# Patient Record
Sex: Female | Born: 1945
Health system: Southern US, Community
[De-identification: ages and names within clinical notes are randomized; demographics above are authoritative.]

## PROBLEM LIST (undated history)

## (undated) DIAGNOSIS — E785 Hyperlipidemia, unspecified: Secondary | ICD-10-CM

## (undated) DIAGNOSIS — Z8669 Personal history of other diseases of the nervous system and sense organs: Secondary | ICD-10-CM

## (undated) DIAGNOSIS — E119 Type 2 diabetes mellitus without complications: Secondary | ICD-10-CM

## (undated) HISTORY — DX: Type 2 diabetes mellitus without complications: E11.9

## (undated) HISTORY — DX: Hyperlipidemia, unspecified: E78.5

## (undated) HISTORY — PX: TONSILLECTOMY: SUR1361

## (undated) HISTORY — DX: Personal history of other diseases of the nervous system and sense organs: Z86.69

---

## 1999-03-30 DIAGNOSIS — E119 Type 2 diabetes mellitus without complications: Secondary | ICD-10-CM

## 1999-03-30 HISTORY — DX: Type 2 diabetes mellitus without complications: E11.9

## 2014-05-07 DIAGNOSIS — E559 Vitamin D deficiency, unspecified: Secondary | ICD-10-CM | POA: Diagnosis not present

## 2014-05-07 DIAGNOSIS — R5383 Other fatigue: Secondary | ICD-10-CM | POA: Diagnosis not present

## 2014-05-07 DIAGNOSIS — D649 Anemia, unspecified: Secondary | ICD-10-CM | POA: Diagnosis not present

## 2014-05-07 DIAGNOSIS — E1165 Type 2 diabetes mellitus with hyperglycemia: Secondary | ICD-10-CM | POA: Diagnosis not present

## 2014-05-07 DIAGNOSIS — I10 Essential (primary) hypertension: Secondary | ICD-10-CM | POA: Diagnosis not present

## 2014-05-07 DIAGNOSIS — E780 Pure hypercholesterolemia: Secondary | ICD-10-CM | POA: Diagnosis not present

## 2014-05-07 DIAGNOSIS — N2581 Secondary hyperparathyroidism of renal origin: Secondary | ICD-10-CM | POA: Diagnosis not present

## 2014-05-07 DIAGNOSIS — M358 Other specified systemic involvement of connective tissue: Secondary | ICD-10-CM | POA: Diagnosis not present

## 2014-05-07 DIAGNOSIS — E038 Other specified hypothyroidism: Secondary | ICD-10-CM | POA: Diagnosis not present

## 2014-05-07 DIAGNOSIS — E1140 Type 2 diabetes mellitus with diabetic neuropathy, unspecified: Secondary | ICD-10-CM | POA: Diagnosis not present

## 2014-05-13 DIAGNOSIS — Z0000 Encounter for general adult medical examination without abnormal findings: Secondary | ICD-10-CM | POA: Diagnosis not present

## 2014-06-17 DIAGNOSIS — R920 Mammographic microcalcification found on diagnostic imaging of breast: Secondary | ICD-10-CM | POA: Diagnosis not present

## 2014-06-17 DIAGNOSIS — R928 Other abnormal and inconclusive findings on diagnostic imaging of breast: Secondary | ICD-10-CM | POA: Diagnosis not present

## 2014-06-26 DIAGNOSIS — M899 Disorder of bone, unspecified: Secondary | ICD-10-CM | POA: Diagnosis not present

## 2014-06-26 DIAGNOSIS — M8580 Other specified disorders of bone density and structure, unspecified site: Secondary | ICD-10-CM | POA: Diagnosis not present

## 2014-06-26 LAB — HM DEXA SCAN

## 2014-07-29 DIAGNOSIS — H11001 Unspecified pterygium of right eye: Secondary | ICD-10-CM | POA: Diagnosis not present

## 2014-07-29 DIAGNOSIS — E119 Type 2 diabetes mellitus without complications: Secondary | ICD-10-CM | POA: Diagnosis not present

## 2014-07-29 DIAGNOSIS — H1131 Conjunctival hemorrhage, right eye: Secondary | ICD-10-CM | POA: Diagnosis not present

## 2014-08-12 DIAGNOSIS — Z01419 Encounter for gynecological examination (general) (routine) without abnormal findings: Secondary | ICD-10-CM | POA: Diagnosis not present

## 2014-09-02 DIAGNOSIS — E559 Vitamin D deficiency, unspecified: Secondary | ICD-10-CM | POA: Diagnosis not present

## 2014-09-02 DIAGNOSIS — R5383 Other fatigue: Secondary | ICD-10-CM | POA: Diagnosis not present

## 2014-09-02 DIAGNOSIS — E1165 Type 2 diabetes mellitus with hyperglycemia: Secondary | ICD-10-CM | POA: Diagnosis not present

## 2014-09-02 DIAGNOSIS — E780 Pure hypercholesterolemia: Secondary | ICD-10-CM | POA: Diagnosis not present

## 2014-09-02 DIAGNOSIS — M358 Other specified systemic involvement of connective tissue: Secondary | ICD-10-CM | POA: Diagnosis not present

## 2014-09-02 DIAGNOSIS — D649 Anemia, unspecified: Secondary | ICD-10-CM | POA: Diagnosis not present

## 2014-09-02 DIAGNOSIS — E038 Other specified hypothyroidism: Secondary | ICD-10-CM | POA: Diagnosis not present

## 2014-09-02 DIAGNOSIS — I10 Essential (primary) hypertension: Secondary | ICD-10-CM | POA: Diagnosis not present

## 2014-09-02 DIAGNOSIS — E1140 Type 2 diabetes mellitus with diabetic neuropathy, unspecified: Secondary | ICD-10-CM | POA: Diagnosis not present

## 2014-09-02 LAB — HEPATIC FUNCTION PANEL
ALT: 17 U/L (ref 7–35)
AST: 23 U/L (ref 13–35)
Alkaline Phosphatase: 93.9 U/L (ref 25–125)

## 2014-09-02 LAB — BASIC METABOLIC PANEL
BUN: 21 mg/dL (ref 4–21)
Creatinine: 0.8 mg/dL (ref 0.5–1.1)
Glucose: 124 mg/dL
Potassium: 4.5 mmol/L (ref 3.4–5.3)
Sodium: 142 mmol/L (ref 137–147)

## 2014-09-02 LAB — LIPID PANEL
Cholesterol: 167 mg/dL (ref 0–200)
HDL: 74 mg/dL — AB (ref 35–70)
Triglycerides: 82 mg/dL (ref 40–160)

## 2014-09-02 LAB — POCT ERYTHROCYTE SEDIMENTATION RATE, NON-AUTOMATED: Sed Rate: 6 mm

## 2014-09-02 LAB — HEMOGLOBIN A1C: Hemoglobin A1C: 6.8

## 2014-09-03 LAB — CBC AND DIFFERENTIAL
HCT: 46 % (ref 36–46)
Hemoglobin: 15.1 g/dL (ref 12.0–16.0)
Platelets: 175 10*3/uL (ref 150–399)
WBC: 5.5 10^3/mL

## 2014-11-11 DIAGNOSIS — M25511 Pain in right shoulder: Secondary | ICD-10-CM | POA: Diagnosis not present

## 2014-11-11 DIAGNOSIS — E780 Pure hypercholesterolemia: Secondary | ICD-10-CM | POA: Diagnosis not present

## 2014-11-11 DIAGNOSIS — E119 Type 2 diabetes mellitus without complications: Secondary | ICD-10-CM | POA: Diagnosis not present

## 2015-01-13 DIAGNOSIS — E1165 Type 2 diabetes mellitus with hyperglycemia: Secondary | ICD-10-CM | POA: Diagnosis not present

## 2015-01-13 DIAGNOSIS — I10 Essential (primary) hypertension: Secondary | ICD-10-CM | POA: Diagnosis not present

## 2015-01-13 DIAGNOSIS — M25512 Pain in left shoulder: Secondary | ICD-10-CM | POA: Diagnosis not present

## 2015-01-13 DIAGNOSIS — E038 Other specified hypothyroidism: Secondary | ICD-10-CM | POA: Diagnosis not present

## 2015-01-13 DIAGNOSIS — E780 Pure hypercholesterolemia: Secondary | ICD-10-CM | POA: Diagnosis not present

## 2015-01-13 DIAGNOSIS — M25511 Pain in right shoulder: Secondary | ICD-10-CM | POA: Diagnosis not present

## 2015-01-13 DIAGNOSIS — N951 Menopausal and female climacteric states: Secondary | ICD-10-CM | POA: Diagnosis not present

## 2015-01-13 DIAGNOSIS — M358 Other specified systemic involvement of connective tissue: Secondary | ICD-10-CM | POA: Diagnosis not present

## 2015-01-13 DIAGNOSIS — E559 Vitamin D deficiency, unspecified: Secondary | ICD-10-CM | POA: Diagnosis not present

## 2015-01-13 DIAGNOSIS — E1140 Type 2 diabetes mellitus with diabetic neuropathy, unspecified: Secondary | ICD-10-CM | POA: Diagnosis not present

## 2015-01-13 DIAGNOSIS — D649 Anemia, unspecified: Secondary | ICD-10-CM | POA: Diagnosis not present

## 2015-01-13 LAB — LIPID PANEL
Cholesterol: 170 mg/dL (ref 0–200)
HDL: 65 mg/dL (ref 35–70)
Triglycerides: 127 mg/dL (ref 40–160)

## 2015-01-13 LAB — HEPATIC FUNCTION PANEL
ALT: 17 U/L (ref 7–35)
AST: 19 U/L (ref 13–35)
Alkaline Phosphatase: 91.9 U/L (ref 25–125)

## 2015-01-13 LAB — BASIC METABOLIC PANEL
BUN: 14 mg/dL (ref 4–21)
Creatinine: 0.7 mg/dL (ref <=1.1)
Glucose: 87 mg/dL
Potassium: 4.1 mmol/L (ref 3.4–5.3)
Sodium: 140 mmol/L (ref 137–147)

## 2015-01-13 LAB — HEMOGLOBIN A1C: Hemoglobin A1C: 6.9

## 2015-01-13 LAB — POCT ERYTHROCYTE SEDIMENTATION RATE, NON-AUTOMATED: Sed Rate: 6 mm

## 2015-01-14 LAB — CBC AND DIFFERENTIAL
HCT: 43 % (ref 36–46)
Hemoglobin: 14.4 g/dL (ref 12.0–16.0)
Platelets: 171 10*3/uL (ref 150–399)
WBC: 5.8 10^3/mL

## 2015-01-15 DIAGNOSIS — M67814 Other specified disorders of tendon, left shoulder: Secondary | ICD-10-CM | POA: Diagnosis not present

## 2015-01-15 DIAGNOSIS — M7552 Bursitis of left shoulder: Secondary | ICD-10-CM | POA: Diagnosis not present

## 2015-01-15 DIAGNOSIS — M7542 Impingement syndrome of left shoulder: Secondary | ICD-10-CM | POA: Diagnosis not present

## 2015-01-27 DIAGNOSIS — Z23 Encounter for immunization: Secondary | ICD-10-CM | POA: Diagnosis not present

## 2015-02-12 DIAGNOSIS — Z23 Encounter for immunization: Secondary | ICD-10-CM | POA: Diagnosis not present

## 2015-05-15 DIAGNOSIS — E119 Type 2 diabetes mellitus without complications: Secondary | ICD-10-CM | POA: Diagnosis not present

## 2015-05-15 DIAGNOSIS — H9201 Otalgia, right ear: Secondary | ICD-10-CM | POA: Diagnosis not present

## 2015-05-15 DIAGNOSIS — E7800 Pure hypercholesterolemia, unspecified: Secondary | ICD-10-CM | POA: Diagnosis not present

## 2015-05-16 LAB — LIPID PANEL
Cholesterol: 156 mg/dL (ref 0–200)
HDL: 70 mg/dL (ref 35–70)
LDL Cholesterol: 86 mg/dL
Triglycerides: 109 mg/dL (ref 40–160)

## 2015-05-16 LAB — BASIC METABOLIC PANEL
Creatinine: 0.8 mg/dL (ref 0.5–1.1)
Glucose: 143 mg/dL
Potassium: 3.8 mmol/L (ref 3.4–5.3)
Sodium: 139 mmol/L (ref 137–147)

## 2015-05-16 LAB — HEPATIC FUNCTION PANEL
ALT: 16 U/L (ref 7–35)
AST: 22 U/L (ref 13–35)
Alkaline Phosphatase: 82 U/L (ref 25–125)
Bilirubin, Total: 0.4 mg/dL

## 2015-05-16 LAB — CBC AND DIFFERENTIAL
HCT: 45 % (ref 36–46)
Hemoglobin: 15.2 g/dL (ref 12.0–16.0)
Platelets: 173 10*3/uL (ref 150–399)
WBC: 5.5 10^3/mL

## 2015-05-19 DIAGNOSIS — I10 Essential (primary) hypertension: Secondary | ICD-10-CM | POA: Diagnosis not present

## 2015-05-19 DIAGNOSIS — E559 Vitamin D deficiency, unspecified: Secondary | ICD-10-CM | POA: Diagnosis not present

## 2015-05-19 DIAGNOSIS — E236 Other disorders of pituitary gland: Secondary | ICD-10-CM | POA: Diagnosis not present

## 2015-05-19 DIAGNOSIS — E782 Mixed hyperlipidemia: Secondary | ICD-10-CM | POA: Diagnosis not present

## 2015-05-19 DIAGNOSIS — M810 Age-related osteoporosis without current pathological fracture: Secondary | ICD-10-CM | POA: Diagnosis not present

## 2015-05-19 DIAGNOSIS — E1165 Type 2 diabetes mellitus with hyperglycemia: Secondary | ICD-10-CM | POA: Diagnosis not present

## 2015-07-21 DIAGNOSIS — J208 Acute bronchitis due to other specified organisms: Secondary | ICD-10-CM | POA: Diagnosis not present

## 2015-07-21 DIAGNOSIS — J028 Acute pharyngitis due to other specified organisms: Secondary | ICD-10-CM | POA: Diagnosis not present

## 2015-08-08 DIAGNOSIS — E559 Vitamin D deficiency, unspecified: Secondary | ICD-10-CM | POA: Diagnosis not present

## 2015-08-08 DIAGNOSIS — I10 Essential (primary) hypertension: Secondary | ICD-10-CM | POA: Diagnosis not present

## 2015-08-08 DIAGNOSIS — N951 Menopausal and female climacteric states: Secondary | ICD-10-CM | POA: Diagnosis not present

## 2015-08-08 DIAGNOSIS — D649 Anemia, unspecified: Secondary | ICD-10-CM | POA: Diagnosis not present

## 2015-08-08 DIAGNOSIS — E1165 Type 2 diabetes mellitus with hyperglycemia: Secondary | ICD-10-CM | POA: Diagnosis not present

## 2015-08-08 DIAGNOSIS — E782 Mixed hyperlipidemia: Secondary | ICD-10-CM | POA: Diagnosis not present

## 2015-08-08 DIAGNOSIS — E1140 Type 2 diabetes mellitus with diabetic neuropathy, unspecified: Secondary | ICD-10-CM | POA: Diagnosis not present

## 2015-08-08 DIAGNOSIS — G9009 Other idiopathic peripheral autonomic neuropathy: Secondary | ICD-10-CM | POA: Diagnosis not present

## 2015-08-08 DIAGNOSIS — E038 Other specified hypothyroidism: Secondary | ICD-10-CM | POA: Diagnosis not present

## 2015-08-08 DIAGNOSIS — R5383 Other fatigue: Secondary | ICD-10-CM | POA: Diagnosis not present

## 2015-12-03 DIAGNOSIS — H2513 Age-related nuclear cataract, bilateral: Secondary | ICD-10-CM | POA: Diagnosis not present

## 2015-12-03 DIAGNOSIS — E119 Type 2 diabetes mellitus without complications: Secondary | ICD-10-CM | POA: Diagnosis not present

## 2015-12-03 DIAGNOSIS — H11001 Unspecified pterygium of right eye: Secondary | ICD-10-CM | POA: Diagnosis not present

## 2016-03-19 DIAGNOSIS — Z23 Encounter for immunization: Secondary | ICD-10-CM | POA: Diagnosis not present

## 2016-03-22 DIAGNOSIS — E782 Mixed hyperlipidemia: Secondary | ICD-10-CM | POA: Diagnosis not present

## 2016-03-22 DIAGNOSIS — N951 Menopausal and female climacteric states: Secondary | ICD-10-CM | POA: Diagnosis not present

## 2016-03-22 DIAGNOSIS — R5383 Other fatigue: Secondary | ICD-10-CM | POA: Diagnosis not present

## 2016-03-22 DIAGNOSIS — D649 Anemia, unspecified: Secondary | ICD-10-CM | POA: Diagnosis not present

## 2016-03-22 DIAGNOSIS — E1165 Type 2 diabetes mellitus with hyperglycemia: Secondary | ICD-10-CM | POA: Diagnosis not present

## 2016-03-22 DIAGNOSIS — E1140 Type 2 diabetes mellitus with diabetic neuropathy, unspecified: Secondary | ICD-10-CM | POA: Diagnosis not present

## 2016-03-22 DIAGNOSIS — I10 Essential (primary) hypertension: Secondary | ICD-10-CM | POA: Diagnosis not present

## 2016-03-22 DIAGNOSIS — E038 Other specified hypothyroidism: Secondary | ICD-10-CM | POA: Diagnosis not present

## 2016-03-22 DIAGNOSIS — M358 Other specified systemic involvement of connective tissue: Secondary | ICD-10-CM | POA: Diagnosis not present

## 2016-03-22 DIAGNOSIS — E559 Vitamin D deficiency, unspecified: Secondary | ICD-10-CM | POA: Diagnosis not present

## 2016-06-30 ENCOUNTER — Ambulatory Visit (INDEPENDENT_AMBULATORY_CARE_PROVIDER_SITE_OTHER): Payer: Medicare Other | Admitting: Family Medicine

## 2016-06-30 ENCOUNTER — Ambulatory Visit: Payer: Self-pay | Admitting: Family Medicine

## 2016-06-30 ENCOUNTER — Encounter: Payer: Self-pay | Admitting: Family Medicine

## 2016-06-30 VITALS — BP 110/70 | HR 62 | Resp 12 | Ht 59.0 in | Wt 132.5 lb

## 2016-06-30 DIAGNOSIS — Z0000 Encounter for general adult medical examination without abnormal findings: Secondary | ICD-10-CM

## 2016-06-30 DIAGNOSIS — E785 Hyperlipidemia, unspecified: Secondary | ICD-10-CM

## 2016-06-30 DIAGNOSIS — E119 Type 2 diabetes mellitus without complications: Secondary | ICD-10-CM | POA: Diagnosis not present

## 2016-06-30 DIAGNOSIS — E1169 Type 2 diabetes mellitus with other specified complication: Secondary | ICD-10-CM | POA: Insufficient documentation

## 2016-06-30 LAB — BASIC METABOLIC PANEL
BUN: 17 mg/dL (ref 6–23)
CO2: 26 mEq/L (ref 19–32)
Calcium: 9.4 mg/dL (ref 8.4–10.5)
Chloride: 104 mEq/L (ref 96–112)
Creatinine, Ser: 0.71 mg/dL (ref 0.40–1.20)
GFR: 86.25 mL/min (ref >60.00)
Glucose, Bld: 77 mg/dL (ref 70–99)
Potassium: 4 mEq/L (ref 3.5–5.1)
Sodium: 138 mEq/L (ref 135–145)

## 2016-06-30 LAB — MICROALBUMIN / CREATININE URINE RATIO
Creatinine,U: 60.4 mg/dL
Microalb Creat Ratio: 1.2 mg/g (ref 0.0–30.0)
Microalb, Ur: <0.7 mg/dL (ref 0.0–1.9)

## 2016-06-30 LAB — HEMOGLOBIN A1C: Hgb A1c MFr Bld: 7.1 % — ABNORMAL HIGH (ref 4.6–6.5)

## 2016-06-30 NOTE — Progress Notes (Signed)
HPI:   Katherine Robinson is a 71 y.o. female, who is here today to establish care with me.  Former PCP: Dr Darlina Rumpf in [place]. Last preventive routine visit: A year ago.  She lives with her husband and mother.  Chronic medical problems: DM II,HLD, and recurrent external otitis among some.   Hx of "chronic left external otitis, due to anatomy variations. She uses otic abx prn.   Diabetes Mellitus II:   Currently on Invokana 100 mg and Onglyza 5 mg daily. When she was living in [place] she was following with endocrinologists. She was on Metformin before,did not tolerate well,so changed to Glucophage. She was on Glucophage for a few years,well tolerated until dose was increased, she developed GI symptoms ans skin pruritus.    HgA1C 02/2016 was 7.0 Checking BS's : 100-110. Hypoglycemia:Denies  She is tolerating medications well. She denies abdominal pain, nausea, vomiting, polydipsia, polyuria, or polyphagia. No numbness, tingling, or burning.  She follows a healthy diet, has not been exercising consistently for the past few weeks, husband just had hip surgery.  She was following with endocrinologists before she moved to [place].  Hyperlipidemia:  Currently on Crestor 5 mg 4 times per week Last FLP 02/2016.  Following a low fat diet: Yes.  She has not noted side effects with medication at this dose. She did not tolerate Simvastatin , it caused cramps.  Last eye exam was on 11/2015.   Review of Systems  Constitutional: Negative for activity change, appetite change, fatigue, fever and unexpected weight change.  HENT: Negative for mouth sores, nosebleeds and trouble swallowing.   Eyes: Negative for redness and visual disturbance.  Respiratory: Negative for cough, shortness of breath and wheezing.   Cardiovascular: Negative for chest pain, palpitations and leg swelling.  Gastrointestinal: Negative for abdominal pain, nausea and vomiting.       Negative  for changes in bowel habits.  Endocrine: Negative for polydipsia, polyphagia and polyuria.  Genitourinary: Negative for decreased urine volume, dysuria and hematuria.  Musculoskeletal: Negative for gait problem and myalgias.  Skin: Negative for rash and wound.  Neurological: Negative for syncope, weakness, numbness and headaches.  Hematological: Negative for adenopathy. Does not bruise/bleed easily.  Psychiatric/Behavioral: Negative for confusion and sleep disturbance. The patient is nervous/anxious.       No current outpatient prescriptions on file prior to visit.   No current facility-administered medications on file prior to visit.      Past Medical History:  Diagnosis Date  . Diabetes mellitus without complication ([place])   . History of ear infections   . Hyperlipidemia    Allergies  Allergen Reactions  . Ivp Dye [Iodinated Diagnostic Agents] Anaphylaxis  . Sulfa Antibiotics Anaphylaxis  . Percocet [Oxycodone-Acetaminophen] Nausea And Vomiting  . Glucophage [Metformin Hcl] Rash    Family History  Problem Relation Age of Onset  . Hypertension Father   . Diabetes Mother   . Depression Mother   . Mental illness Mother   . Kidney disease Mother   . Alzheimer's disease Mother     Social History   Social History  . Marital status: Married    Spouse name: N/A  . Number of children: N/A  . Years of education: N/A   Social History Main Topics  . Smoking status: Never Smoker  . Smokeless tobacco: Never Used  . Alcohol use No  . Drug use: No  . Sexual activity: Not Currently   Other Topics Concern  . None  Social History Narrative  . None    Vitals:   06/30/16 1109  BP: 110/70  Pulse: 62  Resp: 12   O2 sat 97% at RA. Body mass index is 26.76 kg/m.   Physical Exam  Nursing note and vitals reviewed. Constitutional: She is oriented to person, place, and time. She appears well-developed and well-nourished. No distress.  HENT:  Head: Atraumatic.    Mouth/Throat: Oropharynx is clear and moist and mucous membranes are normal.  Eyes: Conjunctivae and EOM are normal. Pupils are equal, round, and reactive to light.  Neck: No tracheal deviation present. No thyroid mass and no thyromegaly present.  Cardiovascular: Normal rate and regular rhythm.   No murmur heard. Pulses:      Dorsalis pedis pulses are 2+ on the right side, and 2+ on the left side.  Respiratory: Effort normal and breath sounds normal. No respiratory distress.  GI: Soft. She exhibits no mass. There is no hepatomegaly. There is no tenderness.  Musculoskeletal: She exhibits no edema or tenderness.  Lymphadenopathy:    She has no cervical adenopathy.  Neurological: She is alert and oriented to person, place, and time. She has normal strength. Coordination and gait normal.  Skin: Skin is warm. No rash noted. No erythema.  Psychiatric: She has a normal mood and affect.  Well groomed, good eye contact.    Diabetic foot exam:  Monofilament normal bilateral. Peripheral pulses present (DP). No calluses No hypertrophic/long toenails.   ASSESSMENT AND PLAN:   Garry was seen today for establish care.  Diagnoses and all orders for this visit:   Lab Results  Component Value Date   HGBA1C 7.1 (H) 06/30/2016   Lab Results  Component Value Date   CREATININE 0.71 06/30/2016   BUN 17 06/30/2016   NA 138 06/30/2016   K 4.0 06/30/2016   CL 104 06/30/2016   CO2 26 06/30/2016   Lab Results  Component Value Date   MICROALBUR <0.7 06/30/2016    Controlled type 2 diabetes mellitus without complication, without long-term current use of insulin ([place])   HgA1C pending. Goal < 7.0 No changes in current management, may need to adjust treatment accordingly. Regular exercise and healthy diet with avoidance of added sugar food intake is an important part of treatment and recommended. We discussed some side effects of current medications. Annual eye exam, periodic dental and  foot care recommended. F/U in 5-6 months  -     Hemoglobin A1c -     Basic metabolic panel -     Microalbumin / creatinine urine ratio   Hyperlipidemia LDL goal <100  Has not tolerated statins well in the past, current regimen has been better tolerated. No changes in current management. F/U in 6 months.  Healthcare maintenance  She is due for colonoscopy and would like to stop having mammogram. Pap smear 04/2015. She would like to hold on colonoscopy until her husband recovers from surgery. We discussed recommendations for breast cancer screening, she will consider having mammogram but prefers to hold on it for now.   45 min face to face OV. > 50% of the visit dedicated to discussion of side effects of medications, goals in regard to HgA1C and lipid numbers, and coordination of care. 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    Takisha Pelle G. [place], MD  [facility]. [place] office.

## 2016-06-30 NOTE — Patient Instructions (Signed)
A few things to remember from today's visit:   Healthcare maintenance  Controlled type 2 diabetes mellitus without complication, without long-term current use of insulin ([place]) - Plan: Hemoglobin P5X, Basic metabolic panel, Microalbumin / creatinine urine ratio  Hyperlipidemia LDL goal <100  HgA1C goal < 7.0. Avoid sugar added food:regular soft drinks, energy drinks, and sports drinks. candy. cakes. cookies. pies and cobblers. sweet rolls, pastries, and donuts. fruit drinks, such as fruitades and fruit punch. dairy desserts, such as ice cream  Mediterranean diet has showed benefits for sugar control.  How much and what type of carbohydrate foods are important for managing diabetes. The balance between how much insulin is in your body and the carbohydrate you eat makes a difference in your blood glucose levels.  Fasting blood sugar ideally 130 or less, 2 hours after meals less than 180.   Regular exercise also will help with controlling disease, daily brisk walking as tolerated for 15-30 min definitively will help.   Avoid skipping meals, blood sugar might drop and cause serious problems. Remember checking feet periodically, good dental hygiene, and annual eye exam.     Please be sure medication list is accurate. If a new problem present, please set up appointment sooner than planned today.

## 2016-06-30 NOTE — Progress Notes (Signed)
Pre visit review using our clinic review tool, if applicable. No additional management support is needed unless otherwise documented below in the visit note. 

## 2016-07-07 ENCOUNTER — Encounter: Payer: Self-pay | Admitting: Family Medicine

## 2016-07-17 ENCOUNTER — Encounter: Payer: Self-pay | Admitting: Family Medicine

## 2016-07-19 ENCOUNTER — Encounter: Payer: Self-pay | Admitting: Family Medicine

## 2016-08-10 ENCOUNTER — Encounter: Payer: Self-pay | Admitting: Family Medicine

## 2016-08-10 DIAGNOSIS — M8580 Other specified disorders of bone density and structure, unspecified site: Secondary | ICD-10-CM | POA: Insufficient documentation

## 2016-10-11 DIAGNOSIS — E1165 Type 2 diabetes mellitus with hyperglycemia: Secondary | ICD-10-CM | POA: Diagnosis not present

## 2016-10-11 DIAGNOSIS — E782 Mixed hyperlipidemia: Secondary | ICD-10-CM | POA: Diagnosis not present

## 2016-10-18 DIAGNOSIS — E1165 Type 2 diabetes mellitus with hyperglycemia: Secondary | ICD-10-CM | POA: Diagnosis not present

## 2016-10-18 DIAGNOSIS — Z833 Family history of diabetes mellitus: Secondary | ICD-10-CM | POA: Diagnosis not present

## 2016-10-18 DIAGNOSIS — E559 Vitamin D deficiency, unspecified: Secondary | ICD-10-CM | POA: Diagnosis not present

## 2016-10-18 DIAGNOSIS — E782 Mixed hyperlipidemia: Secondary | ICD-10-CM | POA: Diagnosis not present

## 2016-10-18 DIAGNOSIS — I10 Essential (primary) hypertension: Secondary | ICD-10-CM | POA: Diagnosis not present

## 2016-10-30 ENCOUNTER — Encounter: Payer: Self-pay | Admitting: Family Medicine

## 2016-11-01 ENCOUNTER — Telehealth: Payer: Self-pay

## 2016-11-01 NOTE — Telephone Encounter (Signed)
Received prior authorization request for Onglyza 5 mg tablets. Request submitted & is pending. Key: [id number]

## 2016-11-01 NOTE — Telephone Encounter (Signed)
PA approved, message sent to patient.

## 2016-11-02 ENCOUNTER — Encounter: Payer: Self-pay | Admitting: Family Medicine

## 2016-11-02 MED ORDER — ONGLYZA 5 MG PO TABS: 5.0000 mg | ORAL_TABLET | Freq: Every day | ORAL | 1 refills | Status: DC

## 2016-11-02 NOTE — Telephone Encounter (Signed)
PA pending for test strips. Key: [id number]

## 2016-11-03 NOTE — Telephone Encounter (Signed)
Received forms from insurance company. Forms filled out & faxed back to insurance company.

## 2016-11-05 ENCOUNTER — Telehealth: Payer: Self-pay

## 2016-11-05 MED ORDER — ONETOUCH ULTRASOFT LANCETS MISC: 3 refills | Status: DC

## 2016-11-05 MED ORDER — ONETOUCH ULTRA 2 W/DEVICE KIT: PACK | 0 refills | Status: DC

## 2016-11-05 MED ORDER — GLUCOSE BLOOD VI STRP: ORAL_STRIP | 3 refills | Status: DC

## 2016-11-05 NOTE — Telephone Encounter (Signed)
Rx sent in for new meter, strips, and lancets.

## 2016-11-05 NOTE — Addendum Note (Signed)
Addended by: Kateri Mc E on: 11/05/2016 04:41 PM   Modules accepted: Orders

## 2016-11-05 NOTE — Telephone Encounter (Signed)
Insurance will not cover Accu-Chek, but they will cover One Touch.   Okay to switch to One Touch?

## 2016-11-05 NOTE — Telephone Encounter (Signed)
That is fine, I do not have a preference in regard to glucometer. Thanks, BJ

## 2017-01-09 NOTE — Progress Notes (Signed)
HPI:   [name] is a 71 y.o. female, who is here today with her husband for 6 months follow up.   She was last seen on 06/30/16. Hx of DM II, she is following with Dr Altheimer, last seen in 09/2016. Next appt tomorrow for lab and next week to follow. HgA1C 09/2016 7.0.   Hyperlipidemia:  Currently on Crestor 5 mg 4 times per week. She would like to discontinue it because her "mouth is bitter." She is following a low fat diet. She tried Simvastatin before but it caused cramps.  She has not noted side effects with medication.   Lipid panel 09/2016  Lab Results   Range    LDL Direct 81 <130 mg/dL  Total Cholesterol 155 25 - 199 MG/DL  Triglycerides 72 10 - 150 MG/DL  HDL Cholesterol 66 35 - 135 MG/DL  Total Chol / HDL Cholesterol 2.3 <4.5     She is exercising regularly, treadmill and stationary bike.  Today she is having eye exam.  She is also due for DEXA and colonoscopy. Last colonoscopy in 04/2011, 2 small polyps found and 5 years follow up was recommended.  Denies abdominal pain, nausea, vomiting, changes in bowel habits, blood in stool or melena.  Last mammogram in 2016. She is not interested in having further mammograms. States that she does not understand the purpose given the facts she is not having any problem.  Hx of osteopenia. She is on Vit D 2000 U daily. States that Ca++ was not recommended because her Ca++ "was very high."    Review of Systems  Constitutional: Negative for activity change, appetite change, fatigue and fever.  HENT: Negative for mouth sores, nosebleeds and trouble swallowing.   Eyes: Negative for redness and visual disturbance.  Respiratory: Negative for cough, shortness of breath and wheezing.   Cardiovascular: Negative for chest pain, palpitations and leg swelling.  Gastrointestinal: Negative for abdominal pain, nausea and vomiting.       Negative for changes in bowel habits.  Endocrine: Negative for cold  intolerance, heat intolerance, polydipsia, polyphagia and polyuria.  Genitourinary: Negative for decreased urine volume, dysuria and hematuria.  Musculoskeletal: Negative for gait problem and myalgias.  Neurological: Negative for syncope, weakness and headaches.  Psychiatric/Behavioral: Negative for confusion and sleep disturbance. The patient is not nervous/anxious.       Current Outpatient Prescriptions on File Prior to Visit  Medication Sig Dispense Refill  . Blood Glucose Monitoring Suppl (ONE TOUCH ULTRA 2) w/Device KIT Use to test blood sugar twice daily. 1 each 0  . glucose blood test strip Use to test blood sugars twice daily. 200 each 3  . INVOKANA 100 MG TABS tablet Take 1 tablet by mouth daily  2  . Lancets (ONETOUCH ULTRASOFT) lancets Use to test blood sugars twice daily. 200 each 3  . ONGLYZA 5 MG TABS tablet Take 1 tablet (5 mg total) by mouth daily. 90 tablet 1   No current facility-administered medications on file prior to visit.      Past Medical History:  Diagnosis Date  . Diabetes mellitus without complication ([place]) 42/68/3419  . History of ear infections   . Hyperlipidemia    Allergies  Allergen Reactions  . Ivp Dye [Iodinated Diagnostic Agents] Anaphylaxis  . Sulfa Antibiotics Anaphylaxis  . Percocet [Oxycodone-Acetaminophen] Nausea And Vomiting  . Glucophage [Metformin Hcl] Rash    Social History   Social History  . Marital status: Married    Spouse  name: N/A  . Number of children: N/A  . Years of education: N/A   Social History Main Topics  . Smoking status: Never Smoker  . Smokeless tobacco: Never Used  . Alcohol use No  . Drug use: No  . Sexual activity: Not Currently   Other Topics Concern  . None   Social History Narrative  . None    Vitals:   01/10/17 0920  BP: 110/70  Pulse: 66  Resp: 12  SpO2: 97%   Body mass index is 26.53 kg/m.  Physical Exam  Nursing note and vitals reviewed. Constitutional: She is oriented to  person, place, and time. She appears well-developed and well-nourished. No distress.  HENT:  Head: Normocephalic and atraumatic.  Mouth/Throat: Oropharynx is clear and moist and mucous membranes are normal.  Eyes: Pupils are equal, round, and reactive to light. Conjunctivae are normal.  Neck: No tracheal deviation present. No thyroid mass and no thyromegaly present.  Cardiovascular: Normal rate and regular rhythm.   No murmur heard. Pulses:      Dorsalis pedis pulses are 2+ on the right side, and 2+ on the left side.  Respiratory: Effort normal and breath sounds normal. No respiratory distress.  GI: Soft. She exhibits no mass. There is no hepatomegaly. There is no tenderness.  Musculoskeletal: She exhibits no edema or tenderness.  Lymphadenopathy:    She has no cervical adenopathy.  Neurological: She is alert and oriented to person, place, and time. She has normal strength. Coordination and gait normal.  Skin: Skin is warm. No rash noted. No erythema.  Psychiatric: She has a normal mood and affect.  Well groomed, good eye contact.    ASSESSMENT AND PLAN:   Ms. Katherine Robinson was seen today for 6 months follow-up.   Diagnoses and all orders for this visit:  Osteopenia, unspecified location  Fall precautions. Continue Vit D 2000 U. In regard to Ca+, recommend 1200 mg through diet. Ca++ 06/2016 was 9.4, 09/2016 it was 9.1. Continue regular exercise, add wt lifting 1-2 times per week.  Controlled type 2 diabetes mellitus without complication, without long-term current use of insulin ([place])  Continue following with Dr Altheimer. She has lab appt tomorrow and eye exam today.  Hyperlipidemia LDL goal <100  We discussed benefits from statins as well as side effects. I sent a new Rx for Crestor with EC, which she tolerated better in the past, no bitter flavor left in her mouth. Continue low fat diet. Last FLP in 09/2016, she is having labs tomorrow.  -     rosuvastatin  (CRESTOR) 5 MG tablet; 1 tab 4 times per week.  Colon cancer screening -     Ambulatory referral to Gastroenterology  Breast cancer screening -     MM SCREENING BREAST TOMO BILATERAL; Future  Asymptomatic postmenopausal estrogen deficiency -     DG Bone Density; Future   25 min face to face OV. > 50% was dedicated to counseling for preventive guidelines,vaccination, above Dx, prognosis, treatment and some side effects of medications. Breast cancer prevention guidelines and statin benefits given her Hx of DM also discussed in detail. We also reviewed labs she had done in 09/2016. She will continue following with Dr Altheimer and I think I can see her annually, before if needed.    -Ms. Katherine Robinson was advised to return sooner than planned today if new concerns arise.      Xaniyah Buchholz G. [place], MD  [facility]. [place] office.

## 2017-01-10 ENCOUNTER — Encounter: Payer: Self-pay | Admitting: Family Medicine

## 2017-01-10 ENCOUNTER — Ambulatory Visit (INDEPENDENT_AMBULATORY_CARE_PROVIDER_SITE_OTHER): Payer: Medicare Other | Admitting: Family Medicine

## 2017-01-10 VITALS — BP 110/70 | HR 66 | Resp 12 | Ht 59.0 in | Wt 131.4 lb

## 2017-01-10 DIAGNOSIS — Z780 Asymptomatic menopausal state: Secondary | ICD-10-CM | POA: Diagnosis not present

## 2017-01-10 DIAGNOSIS — Z9189 Other specified personal risk factors, not elsewhere classified: Secondary | ICD-10-CM | POA: Insufficient documentation

## 2017-01-10 DIAGNOSIS — H524 Presbyopia: Secondary | ICD-10-CM | POA: Diagnosis not present

## 2017-01-10 DIAGNOSIS — Z1239 Encounter for other screening for malignant neoplasm of breast: Secondary | ICD-10-CM

## 2017-01-10 DIAGNOSIS — E119 Type 2 diabetes mellitus without complications: Secondary | ICD-10-CM

## 2017-01-10 DIAGNOSIS — H25813 Combined forms of age-related cataract, bilateral: Secondary | ICD-10-CM | POA: Diagnosis not present

## 2017-01-10 DIAGNOSIS — M8580 Other specified disorders of bone density and structure, unspecified site: Secondary | ICD-10-CM

## 2017-01-10 DIAGNOSIS — E785 Hyperlipidemia, unspecified: Secondary | ICD-10-CM | POA: Diagnosis not present

## 2017-01-10 DIAGNOSIS — Z1231 Encounter for screening mammogram for malignant neoplasm of breast: Secondary | ICD-10-CM | POA: Diagnosis not present

## 2017-01-10 DIAGNOSIS — Z1211 Encounter for screening for malignant neoplasm of colon: Secondary | ICD-10-CM

## 2017-01-10 DIAGNOSIS — H01001 Unspecified blepharitis right upper eyelid: Secondary | ICD-10-CM | POA: Diagnosis not present

## 2017-01-10 LAB — HM DIABETES EYE EXAM

## 2017-01-10 MED ORDER — ROSUVASTATIN CALCIUM 5 MG PO TABS: ORAL_TABLET | ORAL | 2 refills | Status: DC

## 2017-01-10 NOTE — Patient Instructions (Signed)
A few things to remember from today's visit:   Controlled type 2 diabetes mellitus without complication, without long-term current use of insulin ([place])  Hyperlipidemia LDL goal <100  Colon cancer screening - Plan: Ambulatory referral to Gastroenterology  Breast cancer screening - Plan: MM SCREENING BREAST TOMO BILATERAL  Osteopenia, unspecified location  At high risk for osteoporosis  Asymptomatic postmenopausal estrogen deficiency - Plan: DG Bone Density   [name], today we have followed on some of your chronic medical problems and they seem to be stable, so no changes in current management today.  Review medication list and be sure it is accurate.  -Remember a healthy diet and regular physical activity are very important for prevention as well as for well being; they also help with many chronic problems, decreasing the need of adding new medications and delaying or preventing possible complications.  I will see you back in 12 months.  Remember to arrange your follow up appt before leaving today.  Please follow sooner than planned if a new concern arises.  Please be sure medication list is accurate. If a new problem present, please set up appointment sooner than planned today.

## 2017-01-11 DIAGNOSIS — E782 Mixed hyperlipidemia: Secondary | ICD-10-CM | POA: Diagnosis not present

## 2017-01-11 DIAGNOSIS — E1165 Type 2 diabetes mellitus with hyperglycemia: Secondary | ICD-10-CM | POA: Diagnosis not present

## 2017-01-12 ENCOUNTER — Ambulatory Visit
Admission: RE | Admit: 2017-01-12 | Discharge: 2017-01-12 | Disposition: A | Discharge status: Home / Self Care | Payer: Medicare Other | Source: Ambulatory Visit | Attending: Family Medicine | Admitting: Family Medicine

## 2017-01-12 ENCOUNTER — Telehealth: Payer: Self-pay | Admitting: Family Medicine

## 2017-01-12 DIAGNOSIS — Z780 Asymptomatic menopausal state: Secondary | ICD-10-CM

## 2017-01-12 DIAGNOSIS — M8589 Other specified disorders of bone density and structure, multiple sites: Secondary | ICD-10-CM | POA: Diagnosis not present

## 2017-01-12 NOTE — Telephone Encounter (Signed)
Faxed ROI to [facility] [phone] mc

## 2017-01-13 ENCOUNTER — Encounter: Payer: Self-pay | Admitting: Family Medicine

## 2017-01-18 DIAGNOSIS — I10 Essential (primary) hypertension: Secondary | ICD-10-CM | POA: Diagnosis not present

## 2017-01-18 DIAGNOSIS — E1165 Type 2 diabetes mellitus with hyperglycemia: Secondary | ICD-10-CM | POA: Diagnosis not present

## 2017-01-18 DIAGNOSIS — E559 Vitamin D deficiency, unspecified: Secondary | ICD-10-CM | POA: Diagnosis not present

## 2017-01-18 DIAGNOSIS — E782 Mixed hyperlipidemia: Secondary | ICD-10-CM | POA: Diagnosis not present

## 2017-01-18 DIAGNOSIS — Z833 Family history of diabetes mellitus: Secondary | ICD-10-CM | POA: Diagnosis not present

## 2017-01-18 NOTE — Progress Notes (Signed)
Subjective:   Katherine Robinson is a 71 y.o. female who presents for Medicare Annual (Subsequent) preventive examination.  The Patient was informed that the wellness visit is to identify future health risk and educate and initiate measures that can reduce risk for increased disease through the lifespan.    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 Kathrin Penner to seen report   Flu vaccine scheduled for  10/1   Meds; Invokana 120m  Onglyza  Doing well; monitors her Diabetes very closely   Eye exam Sept 17 Dr. 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 SKathrin Pennersept  Complete eye exam  NO DR  and will fax in reprot      Sleep patterns: stays up at hs to write down her goals and review news  Pain - no pain     Cardiac Risk Factors Addressed Hyperlipidemia - chol 156; hdl 70; ldl 86 and trig 109 Diabetes A1c 7.1   Advanced Directives - no   Patient Care Team: JMartinique Betty G, MD as PCP - General (Family Medicine)    Cardiac Risk Factors include: advanced age (>549m, >6>73women);diabetes mellitus;family history of premature cardiovascular disease     Objective:     Vitals: BP 110/60   Pulse (!) 58   Ht _0  (1.575 m)   Wt 129 lb (58.5 kg)   SpO2 97%   BMI 23.59 kg/m   Body mass index is 23.59 kg/m.   Tobacco History  Smoking Status  . Never Smoker  Smokeless Tobacco  . Never Used     Counseling given: Yes   Past Medical History:  Diagnosis Date  . Diabetes mellitus without complication ([id number]. History of ear infections   . Hyperlipidemia    Past Surgical History:  Procedure Laterality Date  . TONSILLECTOMY     Family History  Problem Relation Age of Onset  . Hypertension Father   . Diabetes Mother   . Depression Mother   . Mental illness Mother   . Kidney disease Mother   . Alzheimer's disease Mother    History  Sexual Activity  . Sexual activity: Not Currently    Outpatient Encounter Prescriptions as of 01/19/2017  Medication Sig  . Blood Glucose Monitoring Suppl (ONE TOUCH ULTRA 2) w/Device KIT Use to test blood sugar twice daily.  . [place] Kitchenlucose blood test strip Use to test blood sugars twice daily.  . INVOKANA 100 MG  TABS tablet Take 1 tablet by mouth daily  . Lancets (ONETOUCH ULTRASOFT) lancets Use to test blood sugars twice daily.  . ONGLYZA 5 MG TABS tablet Take 1 tablet (5 mg total) by mouth daily.  . rosuvastatin (CRESTOR) 5 MG tablet 1 tab 4 times per week.   No facility-administered encounter medications on file as of 01/19/2017.     Activities of Daily Living In your present state of health, do you have any difficulty performing the following activities: 01/19/2017  Hearing? N  Vision? N  Difficulty concentrating or making decisions? N  Walking or climbing stairs? N  Dressing or bathing? N  Doing errands, shopping? N  Preparing Food and eating ? N  Using the Toilet? N  In the past six months, have you accidently leaked urine? N  Do you have problems with loss of bowel control? N  Managing your  Medications? N  Managing your Finances? N  Housekeeping or managing your Housekeeping? N    Patient Care Team: [place], Betty G, MD as PCP - General (Family Medicine)    Assessment:     Exercise Activities and Dietary recommendations Current Exercise Habits: Structured exercise class, Time (Minutes): 60, Frequency (Times/Week): 5, Weekly Exercise (Minutes/Week): 300, Intensity: Moderate  Goals    . patient          To learn french      Fall Risk Fall Risk  01/19/2017  Falls in the past year? No   Depression Screen PHQ 2/9 Scores 01/19/2017 01/10/2017  PHQ - 2 Score 0 0     Cognitive Function MMSE - Mini Mental State Exam 01/19/2017  Not completed: (No Data)        Immunization History  Administered Date(s) Administered  . Influenza-Unspecified 02/25/2016  . Pneumococcal Conjugate-13 02/08/2014  . Pneumococcal Polysaccharide-23 02/12/2015  . Tdap 04/26/2014   Screening Tests Health Maintenance  Topic Date Due  . INFLUENZA VACCINE  11/24/2016  . OPHTHALMOLOGY EXAM  12/02/2016  . HEMOGLOBIN A1C  12/31/2016  . MAMMOGRAM  01/28/2017 (Originally 06/17/2016)  . FOOT EXAM  06/30/2017  . URINE MICROALBUMIN  06/30/2017  . COLONOSCOPY  02/06/2024  . TETANUS/TDAP  04/26/2024  . DEXA SCAN  Completed  . Hepatitis C Screening  Completed  . PNA vac Low Risk Adult  Addressed      Plan:     PCP Notes  Health Maintenance Dexa osteopenia  - exercising 5 times per day Was told to eat sardines to increase calcium but does not eat sardines in her culture    Mammogram - scheduled for Oct 3rd; [facility]   Eye exam completed [id number]  - Dr. Kathrin Penner to send report; no issues   Flu is scheduled for 10/1 for her; spouse and ,mother  Abnormal Screens  none  Referrals  None but did educate regarding the [place] as well as online learning regarding caregiving  Patient concerns; As noted   Nurse Concerns; As noted   Next PCP apt - TBS      I have  personally reviewed and noted the following in the patient's chart:   . Medical and social history . Use of alcohol, tobacco or illicit drugs  . Current medications and supplements . Functional ability and status . Nutritional status . Physical activity . Advanced directives . List of other physicians . Hospitalizations, surgeries, and ER visits in previous 12 months . Vitals . Screenings to include cognitive, depression, and falls . Referrals and appointments  In addition,  I have reviewed and discussed with patient certain preventive protocols, quality metrics, and best practice recommendations. A written personalized care plan for preventive services as well as general preventive health recommendations were provided to patient.     Wynetta Fines, RN  01/19/2017

## 2017-01-19 ENCOUNTER — Ambulatory Visit (INDEPENDENT_AMBULATORY_CARE_PROVIDER_SITE_OTHER): Payer: Medicare Other

## 2017-01-19 ENCOUNTER — Ambulatory Visit: Payer: Medicare Other

## 2017-01-19 VITALS — BP 110/60 | HR 58 | Ht 62.0 in | Wt 129.0 lb

## 2017-01-19 DIAGNOSIS — Z0000 Encounter for general adult medical examination without abnormal findings: Secondary | ICD-10-CM | POA: Diagnosis not present

## 2017-01-19 NOTE — Patient Instructions (Addendum)
Katherine Robinson , Thank you for taking time to come for your Medicare Wellness Visit. I appreciate your ongoing commitment to your health goals. Please review the following plan we discussed and let me know if I can assist you in the future.   Alzheimer's Association / Family information and training  Please if time, you can review the online web from [place]; [email or web address]  [employer]  (please mail donations to this address) [address]  [place], [place] [address] P: [phone] F: [employer] [place], [place] [address] P: [phone] F: [address], [address] [place], [place] [address] P: [phone] F: [phone]  Families to call the 800 number to get information on all the resources in the area 24 hour 1 [phone]  Can provide resources; Questions about Dementia; Support groups; Give the caregiver information regarding respite ([facility]) Call the [place] [employer];as they oversee who gets the grant fund for certain areas (076-226-3335) Also have Tools to help navigate the needs of the patient  Opportunities for free educational programs online 100 support groups [place];    Prevention of falls: Remove rugs or any tripping hazards in the home Use Non slip mats in bathtubs and showers Placing grab bars next to the toilet and or shower Placing handrails on both sides of the stair way Adding extra lighting in the home.   Personal safety issues reviewed:  1. Consider starting a community watch program per [facility] 2.  Changes batteries is smoke detector and/or carbon monoxide detector  3.  If you have firearms; keep them in a safe place 4.  Wear protection when in the sun; Always wear sunscreen or a hat; It is good to have your doctor check your skin annually or review any new  areas of concern 5. Driving safety; Keep in the right lane; stay 3 car lengths behind the car in front of you on the highway; look 3 times prior to pulling out; carry your cell phone everywhere you go!    Learn about the Yellow Dot program:  The program allows first responders at your emergency to have access to who your physician is, as well as your medications and medical conditions.  Citizens requesting the Yellow Dot Packages should contact Master Corporal Nunzio Cobbs at the [facility] ([phone] for the first week of the program and beginning the week after Easter citizens should contact their Scientist, physiological.    These are the goals we discussed: Goals    . patient          To learn french       This is a list of the screening recommended for you and due dates:  Health Maintenance  Topic Date Due  . Flu Shot  11/24/2016  . Eye exam for diabetics  12/02/2016  . Hemoglobin A1C  12/31/2016  . Mammogram  01/28/2017*  . Complete foot exam   06/30/2017  . Urine Protein Check  06/30/2017  . Colon Cancer Screening  02/06/2024  . Tetanus Vaccine  04/26/2024  . DEXA scan (bone density measurement)  Completed  .  Hepatitis C: One time screening is recommended by Center for Disease Control  (CDC) for  adults born from [birth year] through 1965.   Completed  . Pneumonia vaccines  Addressed  *Topic was postponed. The date shown is not the original due date.  Fall Prevention in the Home Falls can cause injuries. They can happen to people of all ages. There are many things you can do to make your home safe and to help prevent falls. What can I do on the outside of my home?  Regularly fix the edges of walkways and driveways and fix any cracks.  Remove anything that might make you trip as you walk through a door, such as a raised step or threshold.  Trim any bushes or trees on the path to your home.  Use bright outdoor lighting.  Clear any  walking paths of anything that might make someone trip, such as rocks or tools.  Regularly check to see if handrails are loose or broken. Make sure that both sides of any steps have handrails.  Any raised decks and porches should have guardrails on the edges.  Have any leaves, snow, or ice cleared regularly.  Use sand or salt on walking paths during winter.  Clean up any spills in your garage right away. This includes oil or grease spills. What can I do in the bathroom?  Use night lights.  Install grab bars by the toilet and in the tub and shower. Do not use towel bars as grab bars.  Use non-skid mats or decals in the tub or shower.  If you need to sit down in the shower, use a plastic, non-slip stool.  Keep the floor dry. Clean up any water that spills on the floor as soon as it happens.  Remove soap buildup in the tub or shower regularly.  Attach bath mats securely with double-sided non-slip rug tape.  Do not have throw rugs and other things on the floor that can make you trip. What can I do in the bedroom?  Use night lights.  Make sure that you have a light by your bed that is easy to reach.  Do not use any sheets or blankets that are too big for your bed. They should not hang down onto the floor.  Have a firm chair that has side arms. You can use this for support while you get dressed.  Do not have throw rugs and other things on the floor that can make you trip. What can I do in the kitchen?  Clean up any spills right away.  Avoid walking on wet floors.  Keep items that you use a lot in easy-to-reach places.  If you need to reach something above you, use a strong step stool that has a grab bar.  Keep electrical cords out of the way.  Do not use floor polish or wax that makes floors slippery. If you must use wax, use non-skid floor wax.  Do not have throw rugs and other things on the floor that can make you trip. What can I do with my stairs?  Do not leave  any items on the stairs.  Make sure that there are handrails on both sides of the stairs and use them. Fix handrails that are broken or loose. Make sure that handrails are as long as the stairways.  Check any carpeting to make sure that it is firmly attached to the stairs. Fix any carpet that is loose or worn.  Avoid having throw rugs at the top or bottom of the stairs. If you do have throw rugs, attach them to the floor with carpet tape.  Make sure that you have a light switch at the top of the stairs and the bottom of the stairs. If you  do not have them, ask someone to add them for you. What else can I do to help prevent falls?  Wear shoes that: ? Do not have high heels. ? Have rubber bottoms. ? Are comfortable and fit you well. ? Are closed at the toe. Do not wear sandals.  If you use a stepladder: ? Make sure that it is fully opened. Do not climb a closed stepladder. ? Make sure that both sides of the stepladder are locked into place. ? Ask someone to hold it for you, if possible.  Clearly mark and make sure that you can see: ? Any grab bars or handrails. ? First and last steps. ? Where the edge of each step is.  Use tools that help you move around (mobility aids) if they are needed. These include: ? Canes. ? Walkers. ? Scooters. ? Crutches.  Turn on the lights when you go into a dark area. Replace any light bulbs as soon as they burn out.  Set up your furniture so you have a clear path. Avoid moving your furniture around.  If any of your floors are uneven, fix them.  If there are any pets around you, be aware of where they are.  Review your medicines with your doctor. Some medicines can make you feel dizzy. This can increase your chance of falling. Ask your doctor what other things that you can do to help prevent falls. This information is not intended to replace advice given to you by your health care provider. Make sure you discuss any questions you have with your  health care provider. Document Released: 02/06/2009 Document Revised: 09/18/2015 Document Reviewed: 05/17/2014 Elsevier Interactive Patient Education  2018 [place] Maintenance, Female Adopting a healthy lifestyle and getting preventive care can go a long way to promote health and wellness. Talk with your health care provider about what schedule of regular examinations is right for you. This is a good chance for you to check in with your provider about disease prevention and staying healthy. In between checkups, there are plenty of things you can do on your own. Experts have done a lot of research about which lifestyle changes and preventive measures are most likely to keep you healthy. Ask your health care provider for more information. Weight and diet Eat a healthy diet  Be sure to include plenty of vegetables, fruits, low-fat dairy products, and lean protein.  Do not eat a lot of foods high in solid fats, added sugars, or salt.  Get regular exercise. This is one of the most important things you can do for your health. ? Most adults should exercise for at least 150 minutes each week. The exercise should increase your heart rate and make you sweat (moderate-intensity exercise). ? Most adults should also do strengthening exercises at least twice a week. This is in addition to the moderate-intensity exercise.  Maintain a healthy weight  Body mass index (BMI) is a measurement that can be used to identify possible weight problems. It estimates body fat based on height and weight. Your health care provider can help determine your BMI and help you achieve or maintain a healthy weight.  For females 33 years of age and older: ? A BMI below 18.5 is considered underweight. ? A BMI of 18.5 to 24.9 is normal. ? A BMI of 25 to 29.9 is considered overweight. ? A BMI of 30 and above is considered obese.  Watch levels of cholesterol and blood lipids  You should start  having your blood  tested for lipids and cholesterol at 71 years of age, then have this test every 5 years.  You may need to have your cholesterol levels checked more often if: ? Your lipid or cholesterol levels are high. ? You are older than 71 years of age. ? You are at high risk for heart disease.  Cancer screening Lung Cancer  Lung cancer screening is recommended for adults 33-33 years old who are at high risk for lung cancer because of a history of smoking.  A yearly low-dose CT scan of the lungs is recommended for people who: ? Currently smoke. ? Have quit within the past 15 years. ? Have at least a 30-pack-year history of smoking. A pack year is smoking an average of one pack of cigarettes a day for 1 year.  Yearly screening should continue until it has been 15 years since you quit.  Yearly screening should stop if you develop a health problem that would prevent you from having lung cancer treatment.  Breast Cancer  Practice breast self-awareness. This means understanding how your breasts normally appear and feel.  It also means doing regular breast self-exams. Let your health care provider know about any changes, no matter how small.  If you are in your 20s or 30s, you should have a clinical breast exam (CBE) by a health care provider every 1-3 years as part of a regular health exam.  If you are 70 or older, have a CBE every year. Also consider having a breast X-ray (mammogram) every year.  If you have a family history of breast cancer, talk to your health care provider about genetic screening.  If you are at high risk for breast cancer, talk to your health care provider about having an MRI and a mammogram every year.  Breast cancer gene (BRCA) assessment is recommended for women who have family members with BRCA-related cancers. BRCA-related cancers include: ? Breast. ? Ovarian. ? Tubal. ? Peritoneal cancers.  Results of the assessment will determine the need for genetic counseling and  BRCA1 and BRCA2 testing.  Cervical Cancer Your health care provider may recommend that you be screened regularly for cancer of the pelvic organs (ovaries, uterus, and vagina). This screening involves a pelvic examination, including checking for microscopic changes to the surface of your cervix (Pap test). You may be encouraged to have this screening done every 3 years, beginning at age 57.  For women ages 55-65, health care providers may recommend pelvic exams and Pap testing every 3 years, or they may recommend the Pap and pelvic exam, combined with testing for human papilloma virus (HPV), every 5 years. Some types of HPV increase your risk of cervical cancer. Testing for HPV may also be done on women of any age with unclear Pap test results.  Other health care providers may not recommend any screening for nonpregnant women who are considered low risk for pelvic cancer and who do not have symptoms. Ask your health care provider if a screening pelvic exam is right for you.  If you have had past treatment for cervical cancer or a condition that could lead to cancer, you need Pap tests and screening for cancer for at least 20 years after your treatment. If Pap tests have been discontinued, your risk factors (such as having a new sexual partner) need to be reassessed to determine if screening should resume. Some women have medical problems that increase the chance of getting cervical cancer. In these cases, your health  care provider may recommend more frequent screening and Pap tests.  Colorectal Cancer  This type of cancer can be detected and often prevented.  Routine colorectal cancer screening usually begins at 71 years of age and continues through 71 years of age.  Your health care provider may recommend screening at an earlier age if you have risk factors for colon cancer.  Your health care provider may also recommend using home test kits to check for hidden blood in the stool.  A small camera  at the end of a tube can be used to examine your colon directly (sigmoidoscopy or colonoscopy). This is done to check for the earliest forms of colorectal cancer.  Routine screening usually begins at age 25.  Direct examination of the colon should be repeated every 5-10 years through 71 years of age. However, you may need to be screened more often if early forms of precancerous polyps or small growths are found.  Skin Cancer  Check your skin from head to toe regularly.  Tell your health care provider about any new moles or changes in moles, especially if there is a change in a mole's shape or color.  Also tell your health care provider if you have a mole that is larger than the size of a pencil eraser.  Always use sunscreen. Apply sunscreen liberally and repeatedly throughout the day.  Protect yourself by wearing long sleeves, pants, a wide-brimmed hat, and sunglasses whenever you are outside.  Heart disease, diabetes, and high blood pressure  High blood pressure causes heart disease and increases the risk of stroke. High blood pressure is more likely to develop in: ? People who have blood pressure in the high end of the normal range (130-139/85-89 mm Hg). ? People who are overweight or obese. ? People who are African American.  If you are 79-1 years of age, have your blood pressure checked every 3-5 years. If you are 28 years of age or older, have your blood pressure checked every year. You should have your blood pressure measured twice-once when you are at a hospital or clinic, and once when you are not at a hospital or clinic. Record the average of the two measurements. To check your blood pressure when you are not at a hospital or clinic, you can use: ? An automated blood pressure machine at a pharmacy. ? A home blood pressure monitor.  If you are between 57 years and 8 years old, ask your health care provider if you should take aspirin to prevent strokes.  Have regular diabetes  screenings. This involves taking a blood sample to check your fasting blood sugar level. ? If you are at a normal weight and have a low risk for diabetes, have this test once every three years after 71 years of age. ? If you are overweight and have a high risk for diabetes, consider being tested at a younger age or more often. Preventing infection Hepatitis B  If you have a higher risk for hepatitis B, you should be screened for this virus. You are considered at high risk for hepatitis B if: ? You were born in a country where hepatitis B is common. Ask your health care provider which countries are considered high risk. ? Your parents were born in a high-risk country, and you have not been immunized against hepatitis B (hepatitis B vaccine). ? You have HIV or AIDS. ? You use needles to inject street drugs. ? You live with someone who has hepatitis B. ?  You have had sex with someone who has hepatitis B. ? You get hemodialysis treatment. ? You take certain medicines for conditions, including cancer, organ transplantation, and autoimmune conditions.  Hepatitis C  Blood testing is recommended for: ? Everyone born from [birth year] through 1965. ? Anyone with known risk factors for hepatitis C.  Sexually transmitted infections (STIs)  You should be screened for sexually transmitted infections (STIs) including gonorrhea and chlamydia if: ? You are sexually active and are younger than 71 years of age. ? You are older than 71 years of age and your health care provider tells you that you are at risk for this type of infection. ? Your sexual activity has changed since you were last screened and you are at an increased risk for chlamydia or gonorrhea. Ask your health care provider if you are at risk.  If you do not have HIV, but are at risk, it may be recommended that you take a prescription medicine daily to prevent HIV infection. This is called pre-exposure prophylaxis (PrEP). You are considered at risk  if: ? You are sexually active and do not regularly use condoms or know the HIV status of your partner(s). ? You take drugs by injection. ? You are sexually active with a partner who has HIV.  Talk with your health care provider about whether you are at high risk of being infected with HIV. If you choose to begin PrEP, you should first be tested for HIV. You should then be tested every 3 months for as long as you are taking PrEP. Pregnancy  If you are premenopausal and you may become pregnant, ask your health care provider about preconception counseling.  If you may become pregnant, take 400 to 800 micrograms (mcg) of folic acid every day.  If you want to prevent pregnancy, talk to your health care provider about birth control (contraception). Osteoporosis and menopause  Osteoporosis is a disease in which the bones lose minerals and strength with aging. This can result in serious bone fractures. Your risk for osteoporosis can be identified using a bone density scan.  If you are 41 years of age or older, or if you are at risk for osteoporosis and fractures, ask your health care provider if you should be screened.  Ask your health care provider whether you should take a calcium or vitamin D supplement to lower your risk for osteoporosis.  Menopause may have certain physical symptoms and risks.  Hormone replacement therapy may reduce some of these symptoms and risks. Talk to your health care provider about whether hormone replacement therapy is right for you. Follow these instructions at home:  Schedule regular health, dental, and eye exams.  Stay current with your immunizations.  Do not use any tobacco products including cigarettes, chewing tobacco, or electronic cigarettes.  If you are pregnant, do not drink alcohol.  If you are breastfeeding, limit how much and how often you drink alcohol.  Limit alcohol intake to no more than 1 drink per day for nonpregnant women. One drink  equals 12 ounces of beer, 5 ounces of wine, or 1 ounces of hard liquor.  Do not use street drugs.  Do not share needles.  Ask your health care provider for help if you need support or information about quitting drugs.  Tell your health care provider if you often feel depressed.  Tell your health care provider if you have ever been abused or do not feel safe at home. This information is not intended to replace  advice given to you by your health care provider. Make sure you discuss any questions you have with your health care provider. Document Released: 10/26/2010 Document Revised: 09/18/2015 Document Reviewed: 01/14/2015 Elsevier Interactive Patient Education  2018 [place]

## 2017-01-19 NOTE — Progress Notes (Signed)
I have reviewed documentation from this visit and I agree with recommendations given.  Ameri Cahoon G. Alishah Schulte, MD  [facility]. [place] office.   

## 2017-01-25 ENCOUNTER — Ambulatory Visit (INDEPENDENT_AMBULATORY_CARE_PROVIDER_SITE_OTHER): Payer: Medicare Other | Admitting: *Deleted

## 2017-01-25 DIAGNOSIS — Z23 Encounter for immunization: Secondary | ICD-10-CM

## 2017-01-26 ENCOUNTER — Ambulatory Visit
Admission: RE | Admit: 2017-01-26 | Discharge: 2017-01-26 | Disposition: A | Discharge status: Home / Self Care | Payer: Medicare Other | Source: Ambulatory Visit | Attending: Family Medicine | Admitting: Family Medicine

## 2017-01-26 ENCOUNTER — Other Ambulatory Visit: Payer: Self-pay | Admitting: Family Medicine

## 2017-01-26 DIAGNOSIS — Z1239 Encounter for other screening for malignant neoplasm of breast: Secondary | ICD-10-CM

## 2017-01-26 DIAGNOSIS — Z1231 Encounter for screening mammogram for malignant neoplasm of breast: Secondary | ICD-10-CM | POA: Diagnosis not present

## 2017-01-27 ENCOUNTER — Telehealth: Payer: Self-pay | Admitting: Gastroenterology

## 2017-01-28 NOTE — Telephone Encounter (Signed)
Dr. Havery Moros reviewed records and has accepted patient. Patient due for recall colon 04/2021. Recall Colon has been entered. Patient has been informed.

## 2017-03-06 ENCOUNTER — Encounter: Payer: Self-pay | Admitting: Family Medicine

## 2017-04-03 ENCOUNTER — Other Ambulatory Visit: Payer: Self-pay | Admitting: Family Medicine

## 2017-04-14 DIAGNOSIS — E038 Other specified hypothyroidism: Secondary | ICD-10-CM | POA: Diagnosis not present

## 2017-04-14 DIAGNOSIS — E782 Mixed hyperlipidemia: Secondary | ICD-10-CM | POA: Diagnosis not present

## 2017-04-14 DIAGNOSIS — I10 Essential (primary) hypertension: Secondary | ICD-10-CM | POA: Diagnosis not present

## 2017-04-14 DIAGNOSIS — D649 Anemia, unspecified: Secondary | ICD-10-CM | POA: Diagnosis not present

## 2017-04-14 DIAGNOSIS — D519 Vitamin B12 deficiency anemia, unspecified: Secondary | ICD-10-CM | POA: Diagnosis not present

## 2017-04-14 DIAGNOSIS — E1140 Type 2 diabetes mellitus with diabetic neuropathy, unspecified: Secondary | ICD-10-CM | POA: Diagnosis not present

## 2017-04-14 DIAGNOSIS — E1165 Type 2 diabetes mellitus with hyperglycemia: Secondary | ICD-10-CM | POA: Diagnosis not present

## 2017-04-14 DIAGNOSIS — E236 Other disorders of pituitary gland: Secondary | ICD-10-CM | POA: Diagnosis not present

## 2017-04-14 DIAGNOSIS — E559 Vitamin D deficiency, unspecified: Secondary | ICD-10-CM | POA: Diagnosis not present

## 2017-04-14 DIAGNOSIS — N2581 Secondary hyperparathyroidism of renal origin: Secondary | ICD-10-CM | POA: Diagnosis not present

## 2017-04-14 DIAGNOSIS — R5383 Other fatigue: Secondary | ICD-10-CM | POA: Diagnosis not present

## 2017-05-30 ENCOUNTER — Encounter: Payer: Self-pay | Admitting: Family Medicine

## 2017-05-30 ENCOUNTER — Ambulatory Visit (INDEPENDENT_AMBULATORY_CARE_PROVIDER_SITE_OTHER): Payer: Medicare Other | Admitting: Family Medicine

## 2017-05-30 VITALS — BP 86/56 | HR 68 | Temp 98.0°F | Wt 129.0 lb

## 2017-05-30 DIAGNOSIS — B9789 Other viral agents as the cause of diseases classified elsewhere: Secondary | ICD-10-CM

## 2017-05-30 DIAGNOSIS — J069 Acute upper respiratory infection, unspecified: Secondary | ICD-10-CM | POA: Diagnosis not present

## 2017-05-30 MED ORDER — NEOMYCIN-POLYMYXIN-HC 1 % OT SOLN: OTIC | 2 refills | Status: AC

## 2017-05-30 NOTE — Progress Notes (Signed)
A to is a 72 year old married female nonsmoker who comes in today accompanied by her husband for evaluation of cold for 3 days  She has had congestion sore throat and cough. No fever chills nausea vomiting diarrhea etc. etc.  BP (!) 86/56 (BP Location: Left Arm, Patient Position: Sitting, Cuff Size: Normal)   Pulse 68   Temp 98 F (36.7 C) (Oral)   Wt 129 lb (58.5 kg)   SpO2 98%   BMI 23.59 kg/m  Well-developed well-nourished female no acute distress HEENT were negative neck was supple no adenopathy lungs are clear  #1 viral syndrome with cough............. treat symptomatically as outlined

## 2017-05-30 NOTE — Patient Instructions (Signed)
Drink lots of liquids  Chloraseptic  Hydromet............Marland Kitchen 1/2 teaspoon 2-3 times daily. For cough  Nettie pot...........Marland Kitchen 18 to the teaspoon of salt/1/8 of the teaspoon of baking soda....... irrigate your right nostril. Then refill the Nettie pot and irrigate it left nostril. Do this twice daily and to your cold symptoms resolve

## 2017-07-26 ENCOUNTER — Telehealth: Payer: Self-pay | Admitting: Family Medicine

## 2017-07-26 NOTE — Telephone Encounter (Signed)
Patient needs her glucose blood test strip  sent to:  [facility] [place], [place] to [facility]istered [facility] [phone] (Phone) [phone] (Fax)    A 90 day supply  The patient also wanted to let the MD know that she no longer sees the endocrinologist since her mother passed away in 07-04-2022

## 2017-07-27 ENCOUNTER — Other Ambulatory Visit: Payer: Self-pay | Admitting: *Deleted

## 2017-07-27 MED ORDER — GLUCOSE BLOOD VI STRP: ORAL_STRIP | 3 refills | Status: DC

## 2017-07-27 NOTE — Telephone Encounter (Signed)
Rx sent to pharmacy for test strips.

## 2017-08-08 DIAGNOSIS — R310 Gross hematuria: Secondary | ICD-10-CM | POA: Diagnosis not present

## 2017-08-08 DIAGNOSIS — E7800 Pure hypercholesterolemia, unspecified: Secondary | ICD-10-CM | POA: Diagnosis not present

## 2017-08-08 DIAGNOSIS — E119 Type 2 diabetes mellitus without complications: Secondary | ICD-10-CM | POA: Diagnosis not present

## 2017-08-08 DIAGNOSIS — R1031 Right lower quadrant pain: Secondary | ICD-10-CM | POA: Diagnosis not present

## 2017-08-09 DIAGNOSIS — R1031 Right lower quadrant pain: Secondary | ICD-10-CM | POA: Diagnosis not present

## 2017-08-09 DIAGNOSIS — R109 Unspecified abdominal pain: Secondary | ICD-10-CM | POA: Diagnosis not present

## 2017-08-09 DIAGNOSIS — I700 Atherosclerosis of aorta: Secondary | ICD-10-CM | POA: Diagnosis not present

## 2017-08-12 DIAGNOSIS — E119 Type 2 diabetes mellitus without complications: Secondary | ICD-10-CM | POA: Diagnosis not present

## 2017-08-12 DIAGNOSIS — R1031 Right lower quadrant pain: Secondary | ICD-10-CM | POA: Diagnosis not present

## 2017-12-16 ENCOUNTER — Other Ambulatory Visit: Payer: Self-pay | Admitting: *Deleted

## 2017-12-16 MED ORDER — ACCU-CHEK AVIVA PLUS W/DEVICE KIT: PACK | 0 refills | Status: AC

## 2017-12-16 MED ORDER — GLUCOSE BLOOD VI STRP: ORAL_STRIP | 12 refills | Status: DC

## 2017-12-16 MED ORDER — ACCU-CHEK SOFTCLIX LANCETS MISC: 12 refills | Status: DC

## 2018-01-24 ENCOUNTER — Ambulatory Visit: Payer: Medicare Other

## 2018-02-08 DIAGNOSIS — Z23 Encounter for immunization: Secondary | ICD-10-CM | POA: Diagnosis not present

## 2018-02-20 DIAGNOSIS — R5383 Other fatigue: Secondary | ICD-10-CM | POA: Diagnosis not present

## 2018-02-20 DIAGNOSIS — E119 Type 2 diabetes mellitus without complications: Secondary | ICD-10-CM | POA: Diagnosis not present

## 2018-02-20 DIAGNOSIS — Z0000 Encounter for general adult medical examination without abnormal findings: Secondary | ICD-10-CM | POA: Diagnosis not present

## 2018-02-20 DIAGNOSIS — E7800 Pure hypercholesterolemia, unspecified: Secondary | ICD-10-CM | POA: Diagnosis not present

## 2018-03-09 ENCOUNTER — Other Ambulatory Visit: Payer: Self-pay | Admitting: Family Medicine

## 2018-03-09 DIAGNOSIS — Z1231 Encounter for screening mammogram for malignant neoplasm of breast: Secondary | ICD-10-CM

## 2018-05-01 ENCOUNTER — Ambulatory Visit: Payer: Medicare Other

## 2018-05-22 ENCOUNTER — Ambulatory Visit
Admission: RE | Admit: 2018-05-22 | Discharge: 2018-05-22 | Disposition: A | Discharge status: Home / Self Care | Payer: Medicare Other | Source: Ambulatory Visit | Attending: Family Medicine | Admitting: Family Medicine

## 2018-05-22 ENCOUNTER — Other Ambulatory Visit: Payer: Self-pay | Admitting: Internal Medicine

## 2018-05-22 DIAGNOSIS — Z1231 Encounter for screening mammogram for malignant neoplasm of breast: Secondary | ICD-10-CM | POA: Diagnosis not present

## 2018-07-03 DIAGNOSIS — Z1211 Encounter for screening for malignant neoplasm of colon: Secondary | ICD-10-CM | POA: Diagnosis not present

## 2018-07-03 DIAGNOSIS — D120 Benign neoplasm of cecum: Secondary | ICD-10-CM | POA: Diagnosis not present

## 2018-07-03 DIAGNOSIS — K635 Polyp of colon: Secondary | ICD-10-CM | POA: Diagnosis not present

## 2018-07-03 DIAGNOSIS — Z5181 Encounter for therapeutic drug level monitoring: Secondary | ICD-10-CM | POA: Diagnosis not present

## 2018-07-04 ENCOUNTER — Encounter: Payer: Self-pay | Admitting: Family Medicine

## 2018-07-04 DIAGNOSIS — E7800 Pure hypercholesterolemia, unspecified: Secondary | ICD-10-CM | POA: Diagnosis not present

## 2018-07-04 DIAGNOSIS — E119 Type 2 diabetes mellitus without complications: Secondary | ICD-10-CM | POA: Diagnosis not present

## 2018-08-11 ENCOUNTER — Other Ambulatory Visit: Payer: Self-pay

## 2018-08-11 ENCOUNTER — Ambulatory Visit (INDEPENDENT_AMBULATORY_CARE_PROVIDER_SITE_OTHER): Payer: Medicare Other | Admitting: Internal Medicine

## 2018-08-11 ENCOUNTER — Encounter: Payer: Self-pay | Admitting: Internal Medicine

## 2018-08-11 VITALS — BP 90/60 | HR 65 | Temp 97.6°F | Ht 62.0 in | Wt 130.0 lb

## 2018-08-11 DIAGNOSIS — E1165 Type 2 diabetes mellitus with hyperglycemia: Secondary | ICD-10-CM | POA: Diagnosis not present

## 2018-08-11 NOTE — Patient Instructions (Addendum)
Please start checking sugars once a day at different times of the day.  Please continue: - Jardiance 10 mg daily before b'fast - Onglyza 5 mg daily but move it before b'fast  Please schedule an appt with Antonieta Iba with nutrition.  Please come back for a follow-up appointment in 3 months.  PATIENT INSTRUCTIONS FOR TYPE 2 DIABETES:  DIET AND EXERCISE Diet and exercise is an important part of diabetic treatment.  We recommended aerobic exercise in the form of brisk walking (working between 40-60% of maximal aerobic capacity, similar to brisk walking) for 150 minutes per week (such as 30 minutes five days per week) along with 3 times per week performing 'resistance' training (using various gauge rubber tubes with handles) 5-10 exercises involving the major muscle groups (upper body, lower body and core) performing 10-15 repetitions (or near fatigue) each exercise. Start at half the above goal but build slowly to reach the above goals. If limited by weight, joint pain, or disability, we recommend daily walking in a swimming pool with water up to waist to reduce pressure from joints while allow for adequate exercise.    BLOOD GLUCOSES Monitoring your blood glucoses is important for continued management of your diabetes. Please check your blood glucoses 2-4 times a day: fasting, before meals and at bedtime (you can rotate these measurements - e.g. one day check before the 3 meals, the next day check before 2 of the meals and before bedtime, etc.).   HYPOGLYCEMIA (low blood sugar) Hypoglycemia is usually a reaction to not eating, exercising, or taking too much insulin/ other diabetes drugs.  Symptoms include tremors, sweating, hunger, confusion, headache, etc. Treat IMMEDIATELY with 15 grams of Carbs: . 4 glucose tablets .  cup regular juice/soda . 2 tablespoons raisins . 4 teaspoons sugar . 1 tablespoon honey Recheck blood glucose in 15 mins and repeat above if still symptomatic/blood glucose  <100.  RECOMMENDATIONS TO REDUCE YOUR RISK OF DIABETIC COMPLICATIONS: * Take your prescribed MEDICATION(S) * Follow a DIABETIC diet: Complex carbs, fiber rich foods, (monounsaturated and polyunsaturated) fats * AVOID saturated/trans fats, high fat foods, >2,300 mg salt per day. * EXERCISE at least 5 times a week for 30 minutes or preferably daily.  * DO NOT SMOKE OR DRINK more than 1 drink a day. * Check your FEET every day. Do not wear tightfitting shoes. Contact us if you develop an ulcer * See your EYE doctor once a year or more if needed * Get a FLU shot once a year * Get a PNEUMONIA vaccine once before and once after age 52 years  GOALS:  * Your Hemoglobin A1c of <7%  * fasting sugars need to be <130 * after meals sugars need to be <180 (2h after you start eating) * Your Systolic BP should be 161 or lower  * Your Diastolic BP should be 80 or lower  * Your HDL (Good Cholesterol) should be 40 or higher  * Your LDL (Bad Cholesterol) should be 100 or lower. * Your Triglycerides should be 150 or lower  * Your Urine microalbumin (kidney function) should be <30 * Your Body Mass Index should be 25 or lower    Please consider the following ways to cut down carbs and fat and increase fiber and micronutrients in your diet: - substitute whole grain for white bread or pasta - substitute brown rice for white rice - substitute 90-calorie flat bread pieces for slices of bread when possible - substitute sweet potatoes or yams for  white potatoes - substitute humus for margarine - substitute tofu for cheese when possible - substitute almond or rice milk for regular milk (would not drink soy milk daily due to concern for soy estrogen influence on breast cancer risk) - substitute dark chocolate for other sweets when possible - substitute water - can add lemon or orange slices for taste - for diet sodas (artificial sweeteners will trick your body that you can eat sweets without getting calories and  will lead you to overeating and weight gain in the long run) - do not skip breakfast or other meals (this will slow down the metabolism and will result in more weight gain over time)  - can try smoothies made from fruit and almond/rice milk in am instead of regular breakfast - can also try old-fashioned (not instant) oatmeal made with almond/rice milk in am - order the dressing on the side when eating salad at a restaurant (pour less than half of the dressing on the salad) - eat as little meat as possible - can try juicing, but should not forget that juicing will get rid of the fiber, so would alternate with eating raw veg./fruits or drinking smoothies - use as little oil as possible, even when using olive oil - can dress a salad with a mix of balsamic vinegar and lemon juice, for e.g. - use agave nectar, stevia sugar, or regular sugar rather than artificial sweateners - steam or broil/roast veggies  - snack on veggies/fruit/nuts (unsalted, preferably) when possible, rather than processed foods - reduce or eliminate aspartame in diet (it is in diet sodas, chewing gum, etc) Read the labels!  Try to read Dr. Janene Harvey book: "Program for Reversing Diabetes" for other ideas for healthy eating.

## 2018-08-11 NOTE — Progress Notes (Signed)
Patient ID: Katherine Robinson, female   DOB: January 02, 1946, 73 y.o.   MRN: [id number]   HPI: Katherine Robinson is a 73 y.o.-year-old female, referred by her PCP, Dr.Aasmaa, for management of DM2, dx in 2000, non-insulin-dependent, fairly well controlled, without long-term complications. She moved to the area from [place].  Reviewed latest HbA1c level: 07/04/2018: HbA1c 7.0% 02/20/2018: HbA1c 7.1% Lab Results  Component Value Date   HGBA1C 7.1 (H) 06/30/2016   HGBA1C 6.9 01/13/2015   HGBA1C 6.8 09/02/2014   09/2016: C-peptide 1.46  Pt is on a regimen of: - Onglyza 5 mg at night - Jardiance 10 mg before breakfast She was on metformin until 2014 when she developed intolerance: "Bitter mouth".  She tried metformin ER but this caused generalized itching, rash, with generalized pain all over her body. She tried Invokana.   Pt checks her sugars 1x a day and they are: - am: 96-115, 132 - 2h after b'fast: n/c - before lunch: n/c - 2h after lunch: n/c - before dinner: n/c - 2h after dinner: n/c - bedtime: n/c - nighttime: n/c Lowest sugar was 96; she has hypoglycemia awareness at 70.  Highest sugar was 132.  Glucometer: Accu-Chek Aviva plus  Pt's meals are: - Breakfast: Oatmeal with pineapple, half a banana, raspberries, blueberries, yogurt, cottage cheese, 1 cup of tea, 1 wheat toast, feta cheese-small piece every morning - Lunch: Soup (1 cup), salad, half a cup of rice with veggies, chicken/fish and one fourth of a bowl from morning oatmeal with yogurt and 2 cookies - Dinner: Popcorn (100 cal) and 2 bottles of water - Snacks: no She saw dietitian in [place] when diagnosed in 2000. She was exercising consistently at [employer] by doing cardio 6 days a week until the coronavirus pandemic started and the gym closed.  Now no exercise.  - no CKD, last BUN/creatinine:  07/04/2018: 13/0.88, GFR 58, glucose 123 02/20/2018: ACR 4 Lab Results  Component Value Date   BUN 17  06/30/2016   BUN 14 01/13/2015   CREATININE 0.71 06/30/2016   CREATININE 0.8 05/16/2015   -+ Dyslipidemia; last set of lipids: 02/20/2018: 150/114/64/66 Lab Results  Component Value Date   CHOL 156 05/16/2015   HDL 70 05/16/2015   LDLCALC 86 05/16/2015   TRIG 109 05/16/2015  On Crestor 5 mg 4 times a week.  - last eye exam was in 11/2016: No DR.  - no numbness and tingling in her feet.  Pt has FH of DM in mother, daughter.  She also has a history of osteopenia-diagnosed in 04/2011.  She has vitamin D deficiency, psoriasis, history of PE 02/2018, colon polyp.  She is a prior ICU nurse, retired in 2016.  ROS: Constitutional: + Weight gain, no weight loss, no fatigue, no subjective hyperthermia, no subjective hypothermia, no nocturia Eyes: no blurry vision, no xerophthalmia ENT: no sore throat, no nodules palpated in neck, no dysphagia, no odynophagia, no hoarseness,, occasional tinnitus, no hypoacusis Cardiovascular: no CP, no SOB, no palpitations, no leg swelling Respiratory: no cough, no SOB, no wheezing Gastrointestinal: no N, no V, no D, no C, no acid reflux Musculoskeletal: no muscle, no joint aches Skin: no rash, + some hair loss Neurological: no tremors, no numbness or tingling/no dizziness/no HAs Psychiatric: no depression, no anxiety  Past Medical History:  Diagnosis Date  . Diabetes mellitus without complication ([place]) 40/34/7425  . History of ear infections   . Hyperlipidemia    Past Surgical History:  Procedure Laterality Date  .  TONSILLECTOMY     Social History   Socioeconomic History  . Marital status: Married    Spouse name: Not on file  . Number of children: 4 Kalman Shan, Aline August) (she also had an ectopic pregnancy)   . Years of education: Not on file  . Highest education level: Not on file  Occupational History  .  Retired ICU nurse  Social Needs  . Financial resource strain: Not on file  . Food insecurity:    Worry: Not on file     Inability: Not on file  . Transportation needs:    Medical: Not on file    Non-medical: Not on file  Tobacco Use  . Smoking status: Never Smoker  . Smokeless tobacco: Never Used  Substance and Sexual Activity  . Alcohol use:  Yes, wine, 2 drinks weekly  . Drug use: No   Current Outpatient Medications on File Prior to Visit  Medication Sig Dispense Refill  . ACCU-CHEK SOFTCLIX LANCETS lancets USE TO TEST BLOOD SUGAR TWICE DAILY. 100 each 12  . Blood Glucose Monitoring Suppl (ACCU-CHEK AVIVA PLUS) w/Device KIT USE TO TEST BLOOD SUGAR TWICE DAILY 1 kit 0  . Cholecalciferol (VITAMIN D) 2000 units CAPS Take 2,000 Units by mouth daily at 12 noon.    Marland Kitchen glucose blood (ACCU-CHEK AVIVA PLUS) test strip USE TO TEST BLOOD SUGAR TWICE DAILY 100 each 12  . JARDIANCE 10 MG TABS tablet     . Multiple Vitamin (MULTIVITAMIN) tablet Take 1 tablet by mouth daily.    . NEOMYCIN-POLYMYXIN-HYDROCORTISONE (CORTISPORIN) 1 % SOLN OTIC solution 2 drops affected ear at bedtime 10 mL 2  . ONGLYZA 5 MG TABS tablet TAKE 1 TABLET DAILY 90 tablet 1  . rosuvastatin (CRESTOR) 5 MG tablet 1 tab 4 times per week. 60 tablet 2   No current facility-administered medications on file prior to visit.    Allergies  Allergen Reactions  . Ivp Dye [Iodinated Diagnostic Agents] Anaphylaxis  . Sulfa Antibiotics Anaphylaxis  . Percocet [Oxycodone-Acetaminophen] Nausea And Vomiting  . Glucophage [Metformin Hcl] Rash   Family History  Problem Relation Age of Onset  . Hypertension Father   . Diabetes Mother   . Depression Mother   . Mental illness Mother   . Kidney disease Mother   . Alzheimer's disease Mother   . Breast cancer Neg Hx   Also, HTN in mother, HL in mother.  PE: BP 90/60   Pulse 65   Temp 97.6 F (36.4 C)   Ht 5' 2"  (1.575 m)   Wt 130 lb (59 kg)   SpO2 97%   BMI 23.78 kg/m  Wt Readings from Last 3 Encounters:  08/11/18 130 lb (59 kg)  05/30/17 129 lb (58.5 kg)  01/19/17 129 lb (58.5 kg)    Constitutional: Normal weight, in NAD Eyes: PERRLA, EOMI, no exophthalmos ENT: moist mucous membranes, no thyromegaly, no cervical lymphadenopathy Cardiovascular: RRR, No MRG Respiratory: CTA B Gastrointestinal: abdomen soft, NT, ND, BS+ Musculoskeletal: no deformities, strength intact in all 4 Skin: moist, warm, no rashes Neurological: no tremor with outstretched hands, DTR normal in all 4  ASSESSMENT: 1. DM2, non-insulin-dependent, uncontrolled, without long-term complications, but with hyperglycemia  PLAN:  1. Patient with long-standing, uncontrolled diabetes, on oral antidiabetic regimen, with good control.  At this visit, we reviewed her sugar log and her sugars appear very well controlled in the morning with only rare sugars above goal.  However, she is not checking sugars later in the day and  I strongly advised her to start doing so.  Her most recent HbA1c 7.0%, at goal, but this is in contrast with her sugars at home.  We discussed that I suspect that this is because her sugars increase throughout the day.  As of now, she is taking Jardiance in the morning and Onglyza at night.  Since Onglyza will mostly after her meals, we discussed about moving this in the morning.  I think this would be enough in helping with the increase in sugars throughout the day.  We also discussed about her diet and I made specific suggestions about how to improve her meals towards a more alkaline diet.  She is interested to learn more about nutrition and a referral to a dietitian was made today. -I do not feel that we need to make any other changes in her regimen at this point. - I suggested to:  Patient Instructions  Please start checking sugars once a day at different times of the day.  Please continue: - Jardiance 10 mg daily before b'fast - Onglyza 5 mg daily but move it before b'fast  Please schedule an appt with Antonieta Iba with nutrition.  Please come back for a follow-up appointment in 3  months.  - Strongly advised her to start checking sugars at different times of the day - check 1x a day, rotating checks - discussed about CBG targets for treatment: 80-130 mg/dL before meals and <180 mg/dL after meals; target HbA1c <7%. - given sugar log and advised how to fill it and to bring it at next appt  - given foot care handout and explained the principles  - given instructions for hypoglycemia management "15-15 rule"  - advised for yearly eye exams - she is due - UTD with flu shot, shingles and DTaP - Return to clinic in 3 mo with sugar log   Philemon Kingdom, MD PhD [facility] Endocrin[facility]

## 2018-08-12 ENCOUNTER — Encounter: Payer: Self-pay | Admitting: Internal Medicine

## 2018-08-18 ENCOUNTER — Encounter: Payer: Medicare Other | Admitting: Dietician

## 2018-08-29 DIAGNOSIS — E119 Type 2 diabetes mellitus without complications: Secondary | ICD-10-CM | POA: Diagnosis not present

## 2018-08-29 DIAGNOSIS — H25813 Combined forms of age-related cataract, bilateral: Secondary | ICD-10-CM | POA: Diagnosis not present

## 2018-08-29 DIAGNOSIS — H5201 Hypermetropia, right eye: Secondary | ICD-10-CM | POA: Diagnosis not present

## 2018-08-29 DIAGNOSIS — H04123 Dry eye syndrome of bilateral lacrimal glands: Secondary | ICD-10-CM | POA: Diagnosis not present

## 2018-08-29 LAB — HM DIABETES EYE EXAM

## 2018-09-05 ENCOUNTER — Encounter: Payer: Medicare Other | Attending: Internal Medicine | Admitting: Dietician

## 2018-09-05 ENCOUNTER — Other Ambulatory Visit: Payer: Self-pay

## 2018-09-05 DIAGNOSIS — E119 Type 2 diabetes mellitus without complications: Secondary | ICD-10-CM | POA: Insufficient documentation

## 2018-09-08 ENCOUNTER — Encounter: Payer: Self-pay | Admitting: Dietician

## 2018-09-08 NOTE — Progress Notes (Signed)
Diabetes Self-Management Education  Visit Type: First/Initial  Appt. Start Time: 1140 Appt. End Time: 1240  09/08/2018  Katherine Robinson, identified by name and date of birth, is a 73 y.o. female with a diagnosis of Diabetes: Type 2.   Patient is here today with her husband.  She states that he has struggled with depression for the last 2 years and did not participate in the appointment.    Distory includes type 2 Diabetes.  She has read "Reversing Diabetes" book by Dr. Alyssa Grove and would like to transition to plant based eating (although continue to prepare meat for her husband).  She doe snot eat red meat, chicken with the skin and has not used milk in the past 3 year. Her last A1C was 7% 06/2018. She also wanted me to update the record that she had shingles and tetenus shot 07/14/18, flu vacine 01/2018, and normal mamogram 04/2018. Medications include Jardiance, Onglyza, vitamin B-12  Patient is Katherine Robinson.  She lives with her husband and moved here from [place] 3 years ago. She worked as an Warden/ranger for 49 years.  She now does the majority of the household chores.   She was going to the gym 6 times per week prior to [place].  She does own a treadmill but does not feel as comfortable using this.  She cuts her lawn and does her own yard work.  ASSESSMENT  Height 5\' 2"  (1.575 m), weight 131 lb (59.4 kg). Body mass index is 23.96 kg/m.  Diabetes Self-Management Education - 09/08/18 1523      Visit Information   Visit Type  First/Initial      Initial Visit   Diabetes Type  Type 2    Are you currently following a meal plan?  No    Are you taking your medications as prescribed?  Yes    Date Diagnosed  2000      Health Coping   How would you rate your overall health?  Good      Psychosocial Assessment   Patient Belief/Attitude about Diabetes  Motivated to manage diabetes    Self-care barriers  None    Self-management support  Doctor's office    Other persons present   Patient;Spouse/SO    Patient Concerns  Nutrition/Meal planning    Special Needs  None    Preferred Learning Style  No preference indicated    Learning Readiness  Ready    How often do you need to have someone help you when you read instructions, pamphlets, or other written materials from your doctor or pharmacy?  1 - Never    What is the last grade level you completed in school?  graduate school      Pre-Education Assessment   Patient understands the diabetes disease and treatment process.  Needs Review    Patient understands incorporating nutritional management into lifestyle.  Needs Review    Patient undertands incorporating physical activity into lifestyle.  Needs Review    Patient understands using medications safely.  Needs Review    Patient understands monitoring blood glucose, interpreting and using results  Needs Review    Patient understands prevention, detection, and treatment of acute complications.  Needs Review    Patient understands prevention, detection, and treatment of chronic complications.  Needs Review    Patient understands how to develop strategies to address psychosocial issues.  Needs Review    Patient understands how to develop strategies to promote health/change behavior.  Needs Review  Complications   Last HgB A1C per patient/outside source  7 %   06/2018   How often do you check your blood sugar?  1-2 times/day    Have you had a dilated eye exam in the past 12 months?  Yes   08/30/18   Have you had a dental exam in the past 12 months?  No    Are you checking your feet?  Yes    How many days per week are you checking your feet?  7      Dietary Intake   Breakfast  hot tea with stevia, oatmeal, 3 prunes, pineapple, 1/2 banana, berries, 1 T cottage cheese, 1/3 small container yogurt, 1 slice White wheat toast with feta, 1/2 orange   9   Lunch  1/2 bowl chicken soup, 1/2 c. jasmine rice, vegetables, slamon or chicken, salad, "need to have dessert"- carrot coake  or 1/4 of the oatmeal from breakfast and rest of yogurt   2   Dinner  100 calorie popcorn, 2 crackers, 1 ounce cheese, 1/4 cup trail mix (nuts, raisins, banana chips, almonds)   7   Beverage(s)  water, hot tea with stevia, 2 ounces of wine with a small amount of peach syrup with lunch 2-3 times per week      Exercise   Exercise Type  Light (walking / raking leaves)   cuts lawn and does yard work   How many days per week to you exercise?  7    How many minutes per day do you exercise?  20    Total minutes per week of exercise  140      Patient Education   Previous Diabetes Education  Yes (please comment)   2000 when diagnosed   Nutrition management   Role of diet in the treatment of diabetes and the relationship between the three main macronutrients and blood glucose level;Meal options for control of blood glucose level and chronic complications.    Physical activity and exercise   Role of exercise on diabetes management, blood pressure control and cardiac health.    Medications  Reviewed patients medication for diabetes, action, purpose, timing of dose and side effects.    Monitoring  Purpose and frequency of SMBG.;Identified appropriate SMBG and/or A1C goals.;Daily foot exams;Yearly dilated eye exam    Acute complications  Taught treatment of hypoglycemia - the 15 rule.    Chronic complications  Relationship between chronic complications and blood glucose control    Psychosocial adjustment  Worked with patient to identify barriers to care and solutions;Role of stress on diabetes      Individualized Goals (developed by patient)   Nutrition  General guidelines for healthy choices and portions discussed    Physical Activity  Exercise 5-7 days per week;30 minutes per day    Medications  take my medication as prescribed    Monitoring   test my blood glucose as discussed    Reducing Risk  examine blood glucose patterns;Other (comment)   plant based   Health Coping  ask for help with  (comment)      Post-Education Assessment   Patient understands the diabetes disease and treatment process.  Demonstrates understanding / competency    Patient understands incorporating nutritional management into lifestyle.  Demonstrates understanding / competency    Patient undertands incorporating physical activity into lifestyle.  Demonstrates understanding / competency    Patient understands using medications safely.  Demonstrates understanding / competency    Patient understands monitoring blood glucose, interpreting  and using results  Demonstrates understanding / competency    Patient understands prevention, detection, and treatment of acute complications.  Demonstrates understanding / competency    Patient understands prevention, detection, and treatment of chronic complications.  Demonstrates understanding / competency    Patient understands how to develop strategies to address psychosocial issues.  Demonstrates understanding / competency    Patient understands how to develop strategies to promote health/change behavior.  Demonstrates understanding / competency      Outcomes   Expected Outcomes  Demonstrated interest in learning. Expect positive outcomes    Future DMSE  PRN    Program Status  Completed       Individualized Plan for Diabetes Self-Management Training:   Learning Objective:  Patient will have a greater understanding of diabetes self-management. Patient education plan is to attend individual and/or group sessions per assessed needs and concerns.   Plan:  Continue to read and transition to a more plant based diet per patient preference.  Discussed protein choices at each meal.  Discussed podcasts and other resources including Becoming Vegan book.  Meal options discussed. She is to continue to take vitamin B-12.  Expected Outcomes:  Demonstrated interest in learning. Expect positive outcomes  Education material provided: Plant based primer  If problems or  questions, patient to contact team via:  Phone and Email  Future DSME appointment: PRN

## 2018-09-16 IMAGING — MG DIGITAL SCREENING BILATERAL MAMMOGRAM WITH CAD
4 series · 4 of 4 positions shown · non-contrast
Comparison: Previous exam(s).

CLINICAL DATA: Screening.

EXAM:
DIGITAL SCREENING BILATERAL MAMMOGRAM WITH CAD

[L CC]
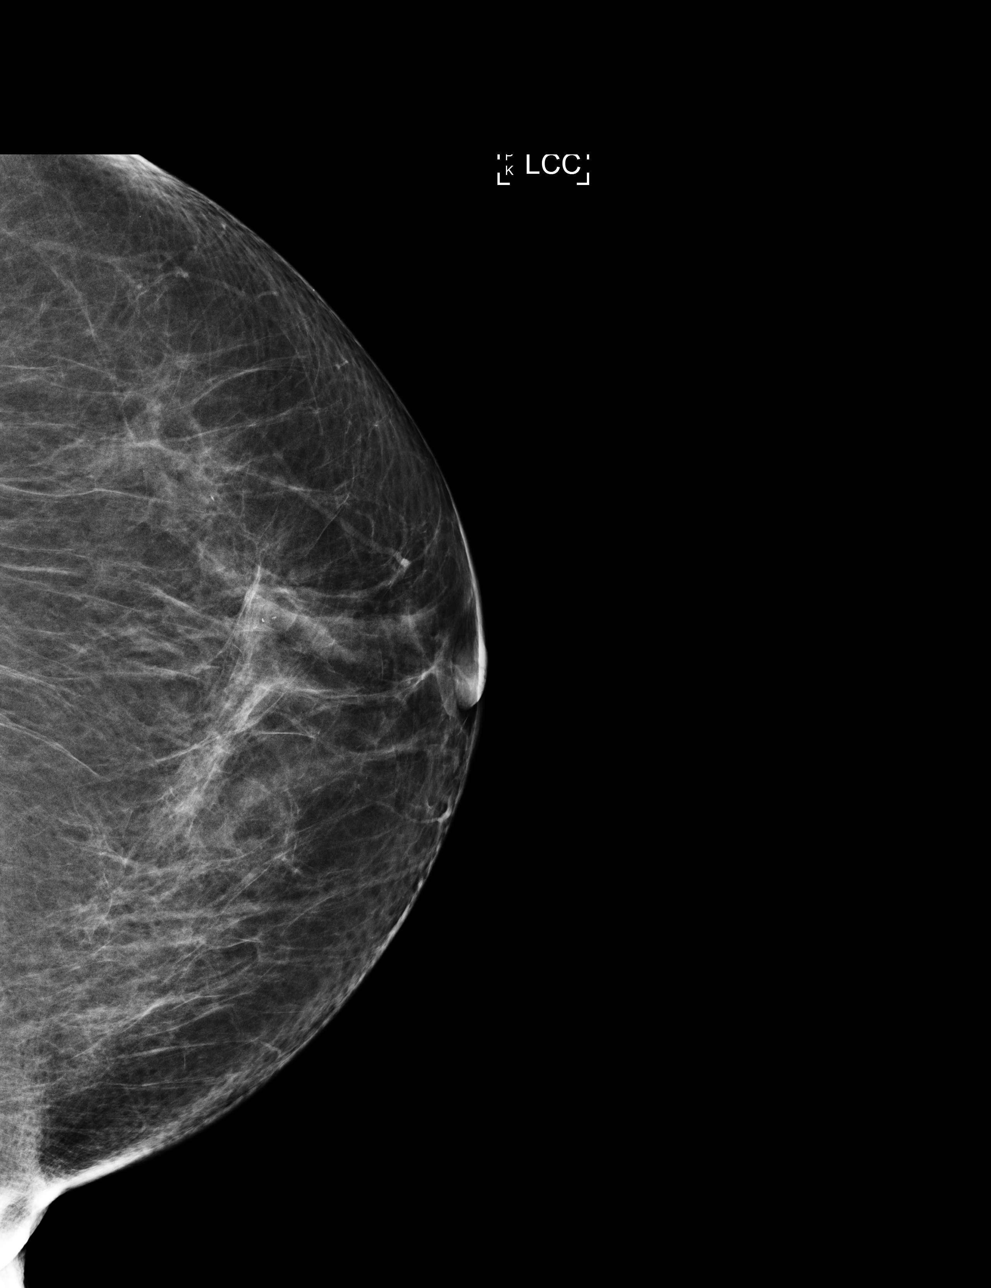

[R CC]
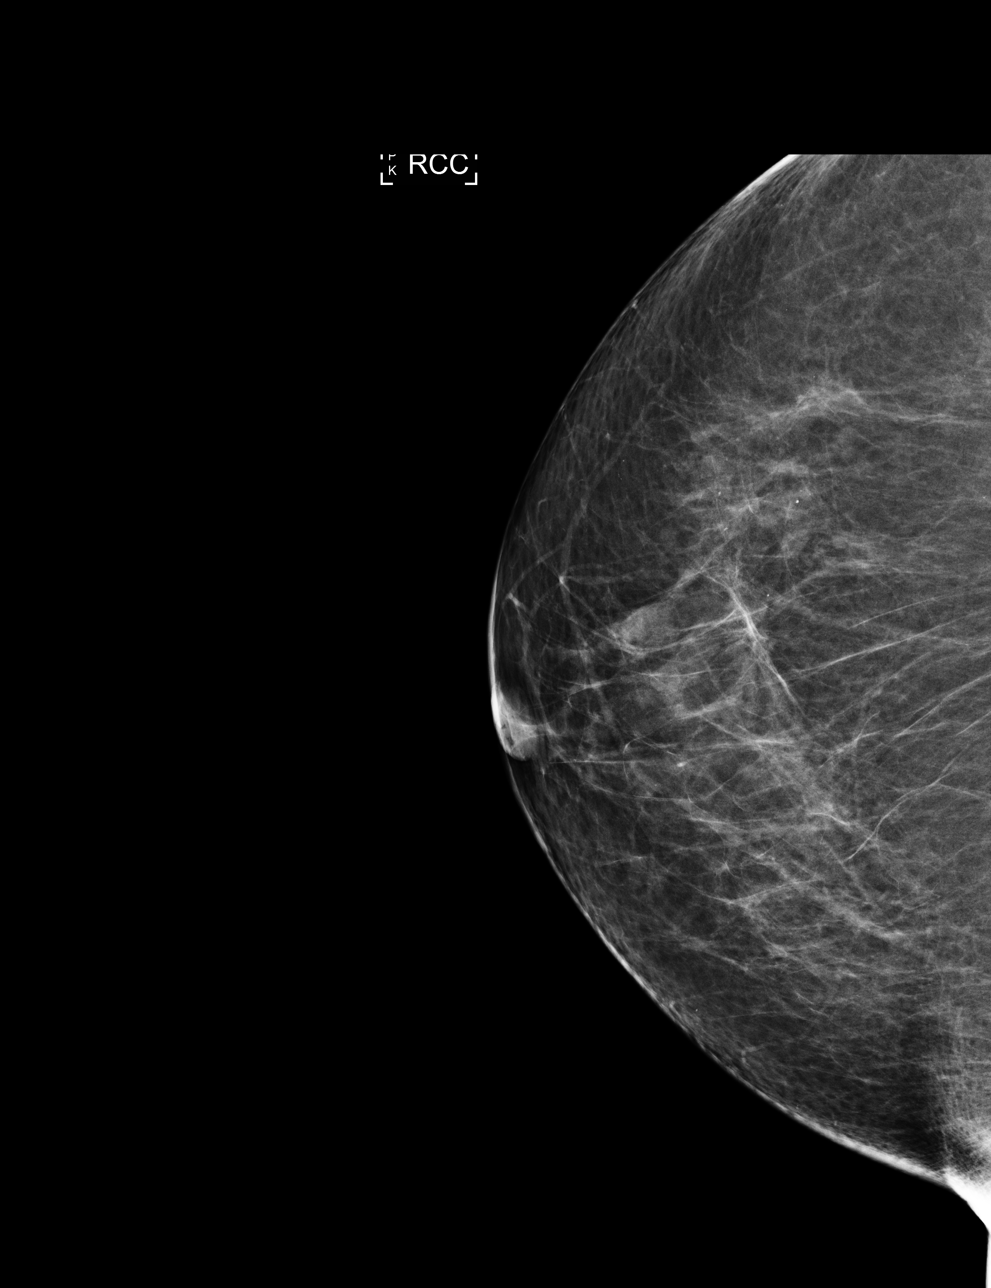

[R MLO]
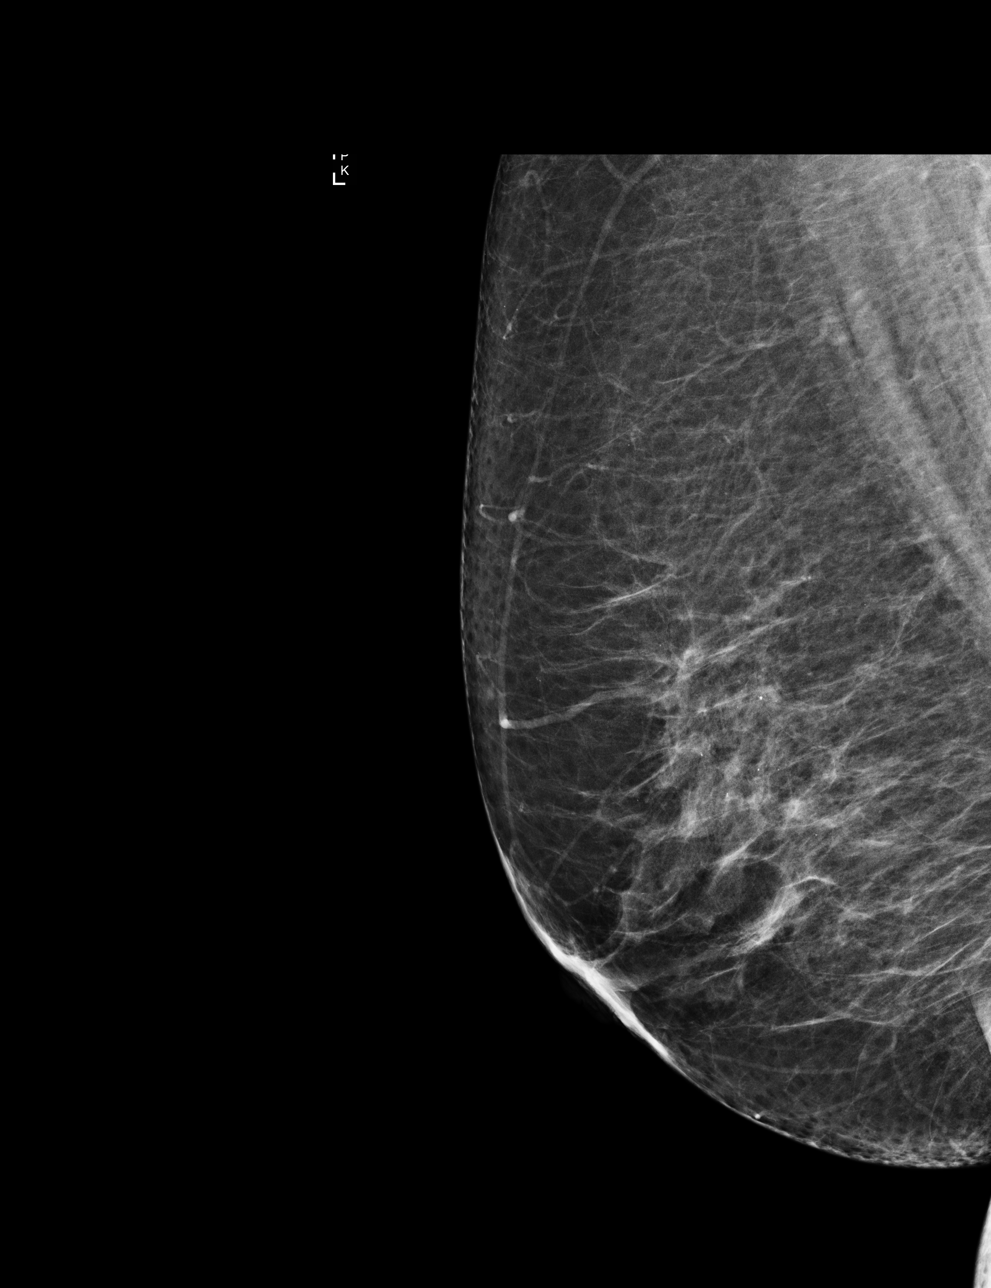

[L MLO]
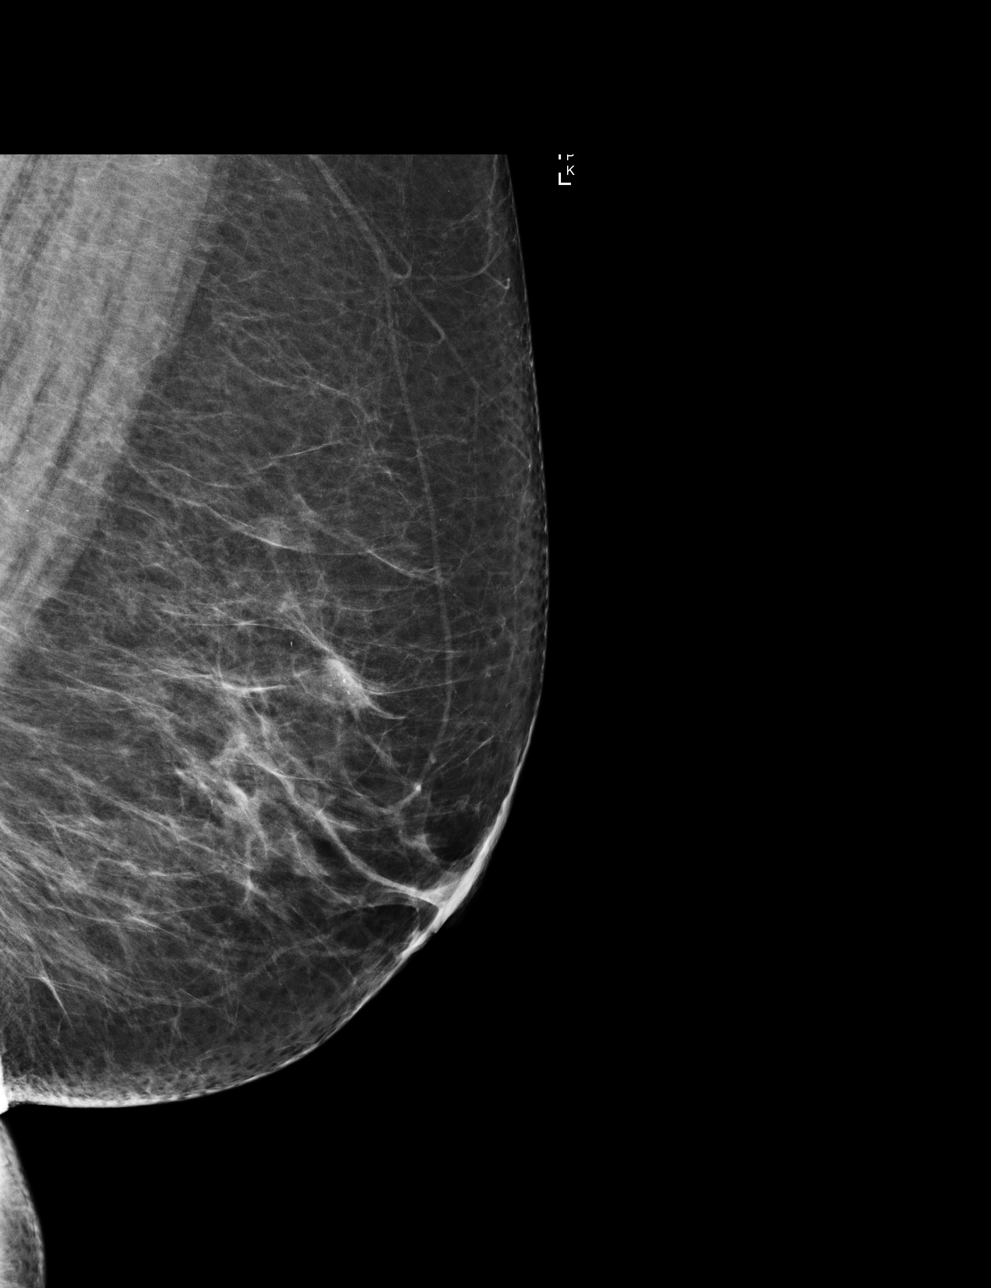

[4 of 4 positions shown; findings below may reference images not displayed]

ACR Breast Density Category b: There are scattered areas of
fibroglandular density.
FINDINGS: There are no findings suspicious for malignancy. Images were
processed with CAD.
IMPRESSION: No mammographic evidence of malignancy. A result letter of this
screening mammogram will be mailed directly to the patient.

RECOMMENDATION:
Screening mammogram in one year. (Code:AS-G-LCT)

BI-RADS CATEGORY  1: Negative.

## 2018-11-10 ENCOUNTER — Other Ambulatory Visit: Payer: Self-pay

## 2018-11-14 ENCOUNTER — Encounter: Payer: Self-pay | Admitting: Internal Medicine

## 2018-11-14 ENCOUNTER — Ambulatory Visit (INDEPENDENT_AMBULATORY_CARE_PROVIDER_SITE_OTHER): Payer: Medicare Other | Admitting: Internal Medicine

## 2018-11-14 ENCOUNTER — Other Ambulatory Visit: Payer: Self-pay

## 2018-11-14 VITALS — BP 98/70 | HR 70 | Ht 62.0 in | Wt 130.0 lb

## 2018-11-14 DIAGNOSIS — E1165 Type 2 diabetes mellitus with hyperglycemia: Secondary | ICD-10-CM | POA: Diagnosis not present

## 2018-11-14 LAB — POCT GLYCOSYLATED HEMOGLOBIN (HGB A1C): Hemoglobin A1C: 6.9 % — AB (ref 4.0–5.6)

## 2018-11-14 MED ORDER — ACCU-CHEK AVIVA PLUS VI STRP: ORAL_STRIP | 12 refills | Status: DC

## 2018-11-14 NOTE — Progress Notes (Signed)
Patient ID: Katherine Robinson, female   DOB: August 03, 1945, 73 y.o.   MRN: [id number]   HPI: Katherine Robinson is a 73 y.o.-year-old female, initially referred by her PCP, Dr.Aasmaa, returning for follow-up for DM2, dx in 2000, non-insulin-dependent, fairly well controlled, without long-term complications.  Last visit 3 months ago. She moved to the area from [place].  At last visit I referred her to nutrition.  She saw our dietitian and expressed an interest in a whole food plant-based diet. She started to change her diet since then. Sugars are improving.  Reviewed HbA1c levels: 07/04/2018: HbA1c 7.0% 02/20/2018: HbA1c 7.1% Lab Results  Component Value Date   HGBA1C 7.1 (H) 06/30/2016   HGBA1C 6.9 01/13/2015   HGBA1C 6.8 09/02/2014  09/2016: C-peptide 1.46  She is on: - Onglyza 5 mg at night >> moved to before breakfast 07/2018 - Jardiance 10 mg before breakfast She was on metformin until 2014 when she developed intolerance: "Bitter mouth".  She tried metformin ER but this caused generalized itching, rash, with generalized pain all over her body. She tried Invokana.   Pt checks her sugars once a day: - am: 96-115, 132 >> 111-129 - 2h after b'fast: n/c - before lunch: n/c >> 79, 83-125, 136 - 2h after lunch: n/c - before dinner: n/c >> 145 - 2h after dinner: n/c - bedtime: n/c >> 114, 146, 176, 190 - nighttime: n/c Lowest sugar was 96; she has hypoglycemia awareness at 70.  Highest sugar was 132.  Glucometer: Accu-Chek Aviva plus  Pt's meals are: - Breakfast: Oatmeal with pineapple, half a banana, raspberries, blueberries, yogurt, cottage cheese, 1 cup of tea, 1 wheat toast, feta cheese-small piece every morning - Lunch: Soup (1 cup), salad, half a cup of rice with veggies, chicken/fish and one fourth of a bowl from morning oatmeal with yogurt and 2 cookies - Dinner: Popcorn (100 cal) and 2 bottles of water - Snacks: no She saw dietitian in [place] when diagnosed in 2000.   She also saw our dietitian in 08/2018.  She was exercising consistently at [employer] by doing cardio 6 times a week, but not during the coronavirus pandemic.  -No CKD, last BUN/creatinine:  07/04/2018: 13/0.88, GFR 58, glucose 123 02/20/2018: ACR 4 Lab Results  Component Value Date   BUN 17 06/30/2016   BUN 14 01/13/2015   CREATININE 0.71 06/30/2016   CREATININE 0.8 05/16/2015   -+ HL; last set of lipids: 02/20/2018: 150/114/64/66 Lab Results  Component Value Date   CHOL 156 05/16/2015   HDL 70 05/16/2015   LDLCALC 86 05/16/2015   TRIG 109 05/16/2015  On Crestor 5 mg 4 times a week..  - last eye exam was in 08/2018: No DR  - no numbness and tingling in her feet.  Pt has FH of DM in mother, daughter.  She also has a history of osteopenia- diagnosed 04/2015.  She has vitamin D deficiency, psoriasis, history of PE 02/2018, colon polyp.  She is a prior ICU nurse, retired in 2016.  ROS: Constitutional: no weight gain/no weight loss, no fatigue, no subjective hyperthermia, no subjective hypothermia Eyes: no blurry vision, no xerophthalmia ENT: no sore throat, no nodules palpated in neck, no dysphagia, no odynophagia, no hoarseness Cardiovascular: no CP/no SOB/no palpitations/no leg swelling Respiratory: no cough/no SOB/no wheezing Gastrointestinal: no N/no V/no D/no C/no acid reflux Musculoskeletal: no muscle aches/no joint aches Skin: no rashes, no hair loss Neurological: no tremors/no numbness/no tingling/no dizziness  I reviewed pt's medications,  allergies, PMH, social hx, family hx, and changes were documented in the history of present illness. Otherwise, unchanged from my initial visit note.  Past Medical History:  Diagnosis Date  . Diabetes mellitus without complication ([place]) 26/41/5830  . History of ear infections   . Hyperlipidemia    Past Surgical History:  Procedure Laterality Date  . TONSILLECTOMY     Social History   Socioeconomic History  .  Marital status: Married    Spouse name: Not on file  . Number of children: 4 Kalman Shan, Aline August) (she also had an ectopic pregnancy)   . Years of education: Not on file  . Highest education level: Not on file  Occupational History  .  Retired ICU nurse  Social Needs  . Financial resource strain: Not on file  . Food insecurity:    Worry: Not on file    Inability: Not on file  . Transportation needs:    Medical: Not on file    Non-medical: Not on file  Tobacco Use  . Smoking status: Never Smoker  . Smokeless tobacco: Never Used  Substance and Sexual Activity  . Alcohol use:  Yes, wine, 2 drinks weekly  . Drug use: No   Current Outpatient Medications on File Prior to Visit  Medication Sig Dispense Refill  . ACCU-CHEK SOFTCLIX LANCETS lancets USE TO TEST BLOOD SUGAR TWICE DAILY. 100 each 12  . Blood Glucose Monitoring Suppl (ACCU-CHEK AVIVA PLUS) w/Device KIT USE TO TEST BLOOD SUGAR TWICE DAILY 1 kit 0  . Cholecalciferol (VITAMIN D) 2000 units CAPS Take 2,000 Units by mouth daily at 12 noon.    Marland Kitchen glucose blood (ACCU-CHEK AVIVA PLUS) test strip USE TO TEST BLOOD SUGAR TWICE DAILY 100 each 12  . JARDIANCE 10 MG TABS tablet     . Multiple Vitamin (MULTIVITAMIN) tablet Take 1 tablet by mouth daily.    . NEOMYCIN-POLYMYXIN-HYDROCORTISONE (CORTISPORIN) 1 % SOLN OTIC solution 2 drops affected ear at bedtime 10 mL 2  . ONGLYZA 5 MG TABS tablet TAKE 1 TABLET DAILY 90 tablet 1  . rosuvastatin (CRESTOR) 5 MG tablet 1 tab 4 times per week. 60 tablet 2  . vitamin B-12 (CYANOCOBALAMIN) 100 MCG tablet Take 100 mcg by mouth daily.     No current facility-administered medications on file prior to visit.    Allergies  Allergen Reactions  . Ivp Dye [Iodinated Diagnostic Agents] Anaphylaxis  . Sulfa Antibiotics Anaphylaxis  . Percocet [Oxycodone-Acetaminophen] Nausea And Vomiting  . Glucophage [Metformin Hcl] Rash   Family History  Problem Relation Age of Onset  . Hypertension Father    . Diabetes Mother   . Depression Mother   . Mental illness Mother   . Kidney disease Mother   . Alzheimer's disease Mother   . Breast cancer Neg Hx   Also, HTN in mother, HL in mother.  PE: BP 98/70   Pulse 70   Ht _0  (1.575 m)   Wt 130 lb (59 kg)   SpO2 97%   BMI 23.78 kg/m  Wt Readings from Last 3 Encounters:  11/14/18 130 lb (59 kg)  09/08/18 131 lb (59.4 kg)  08/11/18 130 lb (59 kg)   Constitutional: Normal  weight, in NAD Eyes: PERRLA, EOMI, no exophthalmos ENT: moist mucous membranes, no thyromegaly, no cervical lymphadenopathy Cardiovascular: RRR, No MRG Respiratory: CTA B Gastrointestinal: abdomen soft, NT, ND, BS+ Musculoskeletal: no deformities, strength intact in all 4 Skin: moist, warm, no rashes Neurological: no tremor with outstretched hands,  DTR normal in all 4  ASSESSMENT: 1. DM2, non-insulin-dependent, uncontrolled, without long-term complications, but with hyperglycemia  2. HL  PLAN:  1. Patient with longstanding, uncontrolled, type 2 diabetes, on oral antidiabetic regimen, with good control.  At last visit she was not checking sugars at that time, she was taking Jardiance in the morning and Onglyza at night and I advised her to the morning, also.  We also discussed about improving her diet and I suggested a plant-based diet.  I also referred her to nutrition and he found out more about the diet and is interested in starting it. -At this visit, sugars are at goal, with only few higher than goal approximately a month ago.  She feels that moving [place] in the morning helped, and also for switching towards a more plant-based diet is also helping.  However, she admits that she did not exercise much since last visit but she stays active working in the yard.  She does have a treadmill at home and plans to restart walking on it.  -I do not feel that she needs a change in her regimen for now. - I suggested to:  Patient Instructions  Please continue: -  Jardiance 10 mg daily before b'fast - Onglyza 5 mg daily before b'fast  Please come back for a follow-up appointment in 4 months.  - we checked her HbA1c: 6.9% (slightly better) - advised to check sugars at different times of the day - 1x a day, rotating check times - advised for yearly eye exams >> she is UTD - return to clinic in 4 months   2. HL - Reviewed latest lipid panel from 2019: All fractions at goal - Continues low-dose Crestor 4 times a week without side effects.  - time spent with the patient: 25 min, of which >50% was spent in obtaining information about her diet, exercise routine and recommendations, reviewing her previous labs, blood sugars, and medication dosing, counseling her about her diabetes and developing a plan to further treat it; she had a number of questions which I addressed.  Philemon Kingdom, MD PhD [facility] Endocrinology

## 2018-11-14 NOTE — Addendum Note (Signed)
Addended by: Cardell Peach I on: 11/14/2018 01:52 PM   Modules accepted: Orders

## 2018-11-14 NOTE — Patient Instructions (Signed)
Please continue: - Jardiance 10 mg daily before b'fast - Onglyza 5 mg daily before b'fast  Please come back for a follow-up appointment in 4 months.

## 2018-12-11 ENCOUNTER — Other Ambulatory Visit: Payer: Self-pay

## 2018-12-11 ENCOUNTER — Ambulatory Visit (INDEPENDENT_AMBULATORY_CARE_PROVIDER_SITE_OTHER): Payer: Medicare Other | Admitting: Family Medicine

## 2018-12-11 ENCOUNTER — Encounter: Payer: Self-pay | Admitting: Family Medicine

## 2018-12-11 VITALS — BP 96/60 | HR 60 | Temp 97.8°F | Ht 62.0 in | Wt 129.5 lb

## 2018-12-11 DIAGNOSIS — M8580 Other specified disorders of bone density and structure, unspecified site: Secondary | ICD-10-CM

## 2018-12-11 DIAGNOSIS — E119 Type 2 diabetes mellitus without complications: Secondary | ICD-10-CM

## 2018-12-11 DIAGNOSIS — E785 Hyperlipidemia, unspecified: Secondary | ICD-10-CM | POA: Diagnosis not present

## 2018-12-11 DIAGNOSIS — E1169 Type 2 diabetes mellitus with other specified complication: Secondary | ICD-10-CM | POA: Diagnosis not present

## 2018-12-11 DIAGNOSIS — E538 Deficiency of other specified B group vitamins: Secondary | ICD-10-CM | POA: Diagnosis not present

## 2018-12-11 DIAGNOSIS — E559 Vitamin D deficiency, unspecified: Secondary | ICD-10-CM | POA: Insufficient documentation

## 2018-12-11 DIAGNOSIS — R7989 Other specified abnormal findings of blood chemistry: Secondary | ICD-10-CM | POA: Diagnosis not present

## 2018-12-11 NOTE — Assessment & Plan Note (Signed)
Check B12 with blood draw.

## 2018-12-11 NOTE — Assessment & Plan Note (Signed)
Continue crestor 5 mg four times per week. Check lipid panel with next blood draw.

## 2018-12-11 NOTE — Assessment & Plan Note (Signed)
Continue Jardiance 10 mg daily and Onglyza 5 mg daily per endocrinology.

## 2018-12-11 NOTE — Progress Notes (Signed)
   Chief Complaint:  Katherine Robinson is a 73 y.o. female who presents today with a chief complaint of T2DM and to transfer care to this office.  Assessment/Plan:  Osteopenia Check vitamin D next blood draw. Will continue taking 2000IU daily.    Low vitamin B12 level Check B12 with blood draw.   Dyslipidemia associated with type 2 diabetes mellitus (HCC) Continue crestor 5 mg four times per week. Check lipid panel with next blood draw.   DM II (diabetes mellitus, type II), controlled ([place]) Continue Jardiance 10 mg daily and Onglyza 5 mg daily per endocrinology.     Subjective:  HPI:  Her stable, chronic medical conditions are outlined below:   # T2DM - Follows with endocrinology - On onglyza 5mg  daily and jardiance 10mg  daily - Home sugars in the low 100s  # Dyslipidemia - On crestor 5mg  four times weekly and tolerating well - ROS: No reported mylagias  # Osteopenia / Low Vitamin D - On vitamin D supplement  # Low B12 - Takes oral replacement.   ROS: Per HPI  PMH: She reports that she has never smoked. She has never used smokeless tobacco. She reports that she does not drink alcohol or use drugs.      Objective:  Physical Exam: BP 96/60   Pulse 60   Temp 97.8 F (36.6 C)   Ht 5\' 2"  (1.575 m)   Wt 129 lb 8 oz (58.7 kg)   SpO2 96%   BMI 23.69 kg/m   Gen: NAD, resting comfortably CV: Regular rate and rhythm with no murmurs appreciated Pulm: Normal work of breathing, clear to auscultation bilaterally with no crackles, wheezes, or rhonchi MSK: No edema, cyanosis, or clubbing noted Skin: Warm, dry Neuro: Grossly normal, moves all extremities Psych: Normal affect and thought content      Deckard Stuber M. [name], MD 12/11/2018 2:54 PM

## 2018-12-11 NOTE — Assessment & Plan Note (Addendum)
Check vitamin D next blood draw. Will continue taking 2000IU daily.

## 2018-12-11 NOTE — Patient Instructions (Signed)
It was very nice to see you today!  Keep up the good work.  Come back in 6 months for your lab visit and annual check up, come back sooner if needed.   Take care, Dr [name]  Please try these tips to maintain a healthy lifestyle:   Eat at least 3 REAL meals and 1-2 snacks per day.  Aim for no more than 5 hours between eating.  If you eat breakfast, please do so within one hour of getting up.    Obtain twice as many fruits/vegetables as protein or carbohydrate foods for both lunch and dinner. (Half of each meal should be fruits/vegetables, one quarter protein, and one quarter starchy carbs)   Cut down on sweet beverages. This includes juice, soda, and sweet tea.    Exercise at least 150 minutes every week.

## 2019-01-15 NOTE — Progress Notes (Signed)
**PT LEFT CLINIC BEFORE BEING SEEN BY MD**    [facility] Note  01/16/2019     CHIEF COMPLAINT Patient presents for Diabetic Eye Exam   HISTORY OF PRESENT ILLNESS: Katherine Robinson is a 73 y.o. female who presents to the clinic today for:   HPI    Diabetic Eye Exam    Vision is stable.  Associated Symptoms Negative for Flashes, Blind Spot, Photophobia, Scalp Tenderness, Fever, Floaters, Pain, Glare, Jaw Claudication, Weight Loss, Distortion, Redness, Trauma, Shoulder/Hip pain and Fatigue.  Diabetes characteristics include Type 2, controlled with diet and taking oral medications.  This started 20 years ago.  Blood sugar level is controlled.  Last Blood Glucose 120 (This am).  Last A1C 6.9.          Comments    Patient is NIDDM for 20 years.  BS is well-controlled. BS was 120 this am.  Last a1c was 6.9. No history of eye surgery or laser. Patient has seen Dr. Kathrin Penner in the past. Vision in [facility] always worse than OD, even as a kid per patient. Was told she may be a suspect for glaucoma. No history of eye gtts. Patient has positive family history of glaucoma.       Last edited by Jobe Marker, COT on 01/16/2019  9:07 AM. (History)      Referring physician: Vivi Barrack, MD [place],  [place] [id number]  HISTORICAL INFORMATION:   Selected notes from the MEDICAL RECORD NUMBER Referred by Dr. Dimas Chyle for DM exam LEE:  Ocular Hx- PMH-DM (A1c: 6.9, 07.21.20, takes Jardiance), HLD   CURRENT MEDICATIONS: No current outpatient medications on file. (Ophthalmic Drugs)   No current facility-administered medications for this visit.  (Ophthalmic Drugs)   Current Outpatient Medications (Other)  Medication Sig  . ACCU-CHEK SOFTCLIX LANCETS lancets USE TO TEST BLOOD SUGAR TWICE DAILY.  Marland Kitchen Blood Glucose Monitoring Suppl (ACCU-CHEK AVIVA PLUS) w/Device KIT USE TO TEST BLOOD SUGAR TWICE DAILY  . Cholecalciferol (VITAMIN D) 2000 units CAPS  Take 2,000 Units by mouth daily at 12 noon.  . Cholecalciferol (VITAMIN D3) 50 MCG (2000 UT) TABS Take by mouth.   Marland Kitchen glucose blood (ACCU-CHEK AVIVA PLUS) test strip USE TO TEST BLOOD SUGAR TWICE DAILY  . JARDIANCE 10 MG TABS tablet 10 mg daily.   . Multiple Vitamin (MULTIVITAMIN) tablet Take 1 tablet by mouth daily.  . ONGLYZA 5 MG TABS tablet TAKE 1 TABLET DAILY  . rosuvastatin (CRESTOR) 5 MG tablet 1 tab 4 times per week.  . vitamin B-12 (CYANOCOBALAMIN) 1000 MCG tablet Take 1,000 mcg by mouth daily.   . NEOMYCIN-POLYMYXIN-HYDROCORTISONE (CORTISPORIN) 1 % SOLN OTIC solution 2 drops affected ear at bedtime (Patient not taking: Reported on 01/16/2019)   No current facility-administered medications for this visit.  (Other)      REVIEW OF SYSTEMS: ROS    Positive for: Endocrine, Cardiovascular, Eyes   Last edited by Roselee Nova D, COT on 01/16/2019  8:46 AM. (History)       ALLERGIES Allergies  Allergen Reactions  . Ivp Dye [Iodinated Diagnostic Agents] Anaphylaxis  . Sulfa Antibiotics Anaphylaxis  . Percocet [Oxycodone-Acetaminophen] Nausea And Vomiting  . Glucophage [Metformin Hcl] Rash    PAST MEDICAL HISTORY Past Medical History:  Diagnosis Date  . Diabetes mellitus without complication ([place]) 17/40/8144  . History of ear infections   . Hyperlipidemia    Past Surgical History:  Procedure Laterality Date  . TONSILLECTOMY  FAMILY HISTORY Family History  Problem Relation Age of Onset  . Hypertension Father   . Diabetes Mother   . Depression Mother   . Mental illness Mother   . Kidney disease Mother   . Alzheimer's disease Mother   . Glaucoma Mother   . Breast cancer Neg Hx     SOCIAL HISTORY Social History   Tobacco Use  . Smoking status: Never Smoker  . Smokeless tobacco: Never Used  Substance Use Topics  . Alcohol use: No  . Drug use: No         OPHTHALMIC EXAM:  Base Eye Exam    Visual Acuity (Snellen - Linear)      Right Left   Dist cc  20/25 -2 20/25   Dist ph cc 20/20 -1 20/20 -1   Correction: Glasses       Visual Acuity #2 (Snellen - Linear)      Right Left   Dist cc 20/20 -2 20/25 -2   Dist ph cc NI 20/20 -1   Correction: Glasses       Visual Acuity Comments   New RX VA 1 Old RX VA 2       Tonometry (Tonopen, 9:04 AM)      Right Left   Pressure 17 15       Pupils      Dark Light Shape React APD   Right 4 2 Round Brisk None   Left 4 3 Round Minimal None       Visual Fields (Counting fingers)      Left Right    Full Full       Extraocular Movement      Right Left    Full, Ortho Full, Ortho       Neuro/Psych    Oriented x3: Yes   Mood/Affect: Normal       Dilation    Both eyes: 1.0% Mydriacyl, 2.5% Phenylephrine @ 9:04 AM        Refraction    Wearing Rx      Sphere Cylinder Axis Add   Right +1.25 Sphere     Left -1.50 +1.00 153    Age: 77 yrs   Type: PAL       Wearing Rx #2      Sphere Cylinder Axis Add   Right +1.25 +0.75 165 +2.50   Left -1.00 +1.00 148 +2.50   Age: 23 mos   Type: PAL       Wearing Rx Comments   Currently wearing       Manifest Refraction      Sphere Cylinder Axis Dist VA   Right +1.75 +0.25 162 20/20-1   Left -0.75 +1.00 150 20/20-1          IMAGING AND PROCEDURES  Imaging and Procedures for _0 @           ASSESSMENT/PLAN:    ICD-10-CM   1. Retinal edema  H35.81 OCT, Retina - OU - Both Eyes    1.  2.  3.  Ophthalmic Meds Ordered this visit:  No orders of the defined types were placed in this encounter.      No follow-ups on file.  There are no Patient Instructions on file for this visit.   Explained the diagnoses, plan, and follow up with the patient and they expressed understanding.  Patient expressed understanding of the importance of proper follow up care.   This document serves as a record of services personally performed by  Gardiner Sleeper, MD, PhD. It was created on their behalf by Ernest Mallick, OA, an  ophthalmic assistant. The creation of this record is the provider's dictation and/or activities during the visit.    Electronically signed by: Ernest Mallick, OA  09.21.2020 9:51 AM    Gardiner Sleeper, M.D., Ph.D. Diseases & Surgery of the Retina and Vitreous [facility]: M myopia (nearsighted); A astigmatism; H hyperopia (farsighted); P presbyopia; Mrx spectacle prescription;  CTL contact lenses; OD right eye; OS left eye; OU both eyes  XT exotropia; ET esotropia; PEK punctate epithelial keratitis; PEE punctate epithelial erosions; DES dry eye syndrome; MGD meibomian gland dysfunction; ATs artificial tears; PFAT's preservative free artificial tears; [place] nuclear sclerotic cataract; PSC posterior subcapsular cataract; ERM epi-retinal membrane; PVD posterior vitreous detachment; RD retinal detachment; DM diabetes mellitus; DR diabetic retinopathy; NPDR non-proliferative diabetic retinopathy; PDR proliferative diabetic retinopathy; CSME clinically significant macular edema; DME diabetic macular edema; dbh dot blot hemorrhages; CWS cotton wool spot; POAG primary open angle glaucoma; C/D cup-to-disc ratio; HVF humphrey visual field; GVF goldmann visual field; OCT optical coherence tomography; IOP intraocular pressure; BRVO Branch retinal vein occlusion; CRVO central retinal vein occlusion; CRAO central retinal artery occlusion; BRAO branch retinal artery occlusion; RT retinal tear; SB scleral buckle; PPV pars plana vitrectomy; VH Vitreous hemorrhage; PRP panretinal laser photocoagulation; IVK intravitreal kenalog; VMT vitreomacular traction; MH Macular hole;  NVD neovascularization of the disc; NVE neovascularization elsewhere; AREDS age related eye disease study; ARMD age related macular degeneration; POAG primary open angle glaucoma; EBMD epithelial/anterior basement membrane dystrophy; ACIOL anterior chamber intraocular lens; IOL intraocular lens; PCIOL posterior  chamber intraocular lens; Phaco/IOL phacoemulsification with intraocular lens placement; [place] photorefractive keratectomy; LASIK laser assisted in situ keratomileusis; HTN hypertension; DM diabetes mellitus; COPD chronic obstructive pulmonary disease

## 2019-01-16 ENCOUNTER — Other Ambulatory Visit: Payer: Self-pay

## 2019-01-16 ENCOUNTER — Encounter (INDEPENDENT_AMBULATORY_CARE_PROVIDER_SITE_OTHER): Payer: Medicare Other | Admitting: Ophthalmology

## 2019-01-16 ENCOUNTER — Encounter (INDEPENDENT_AMBULATORY_CARE_PROVIDER_SITE_OTHER): Payer: Self-pay | Admitting: Ophthalmology

## 2019-01-16 DIAGNOSIS — H3581 Retinal edema: Secondary | ICD-10-CM

## 2019-01-16 NOTE — Progress Notes (Signed)
This encounter was created in error - please disregard.

## 2019-02-03 DIAGNOSIS — Z1159 Encounter for screening for other viral diseases: Secondary | ICD-10-CM | POA: Diagnosis not present

## 2019-03-15 ENCOUNTER — Other Ambulatory Visit: Payer: Self-pay

## 2019-03-19 ENCOUNTER — Encounter: Payer: Self-pay | Admitting: Internal Medicine

## 2019-03-19 ENCOUNTER — Ambulatory Visit (INDEPENDENT_AMBULATORY_CARE_PROVIDER_SITE_OTHER): Payer: Medicare Other | Admitting: Internal Medicine

## 2019-03-19 ENCOUNTER — Other Ambulatory Visit: Payer: Self-pay

## 2019-03-19 VITALS — BP 108/70 | HR 71 | Ht 62.0 in | Wt 131.0 lb

## 2019-03-19 DIAGNOSIS — E785 Hyperlipidemia, unspecified: Secondary | ICD-10-CM | POA: Diagnosis not present

## 2019-03-19 DIAGNOSIS — E1169 Type 2 diabetes mellitus with other specified complication: Secondary | ICD-10-CM

## 2019-03-19 DIAGNOSIS — E1165 Type 2 diabetes mellitus with hyperglycemia: Secondary | ICD-10-CM | POA: Diagnosis not present

## 2019-03-19 LAB — POCT GLYCOSYLATED HEMOGLOBIN (HGB A1C): Hemoglobin A1C: 7 % — AB (ref 4.0–5.6)

## 2019-03-19 NOTE — Patient Instructions (Signed)
Please continue: - Jardiance 10 mg daily before b'fast - Onglyza 5 mg daily before b'fast  Please come back for a follow-up appointment in 4 months.

## 2019-03-19 NOTE — Addendum Note (Signed)
Addended by: Cardell Peach I on: 03/19/2019 01:36 PM   Modules accepted: Orders

## 2019-03-19 NOTE — Progress Notes (Signed)
Patient ID: Katherine Robinson, female   DOB: [birth year], 73 y.o.   MRN: [id number]   This visit occurred during the SARS-CoV-2 public health emergency.  Safety protocols were in place, including screening questions prior to the visit, additional usage of staff PPE, and extensive cleaning of exam room while observing appropriate contact time as indicated for disinfecting solutions.   HPI: Katherine Robinson is a 73 y.o.-year-old female, initially referred by her PCP, Dr.Aasmaa, returning for follow-up for DM2, dx in 2000, non-insulin-dependent, fairly well controlled, without long-term complications.  Last visit 4 months ago. She is here with her husband. She moved to the area from [place].  She started to change her diet to a more plant-based one earlier in the year.  Sugars improved.  Reviewed HbA1c levels: Lab Results  Component Value Date   HGBA1C 6.9 (A) 11/14/2018   HGBA1C 7.1 (H) 06/30/2016   HGBA1C 6.9 01/13/2015   HGBA1C 6.8 09/02/2014  07/04/2018: HbA1c 7.0% 02/20/2018: HbA1c 7.1% 09/2016: C-peptide 1.46  She is on: - Onglyza 5 mg at night >> 2 before breakfast 07/2018: - Jardiance 10 mg before breakfast She was on metformin until 2014 when she developed intolerance: "Bitter mouth".  She tried metformin ER but this caused generalized itching, rash, with generalized pain all over her body. She tried Invokana.   Pt checks her sugars once a day: - am: 96-115, 132 >> 111-129 >> 102-132 - 2h after b'fast: n/c - before lunch: n/c >> 79, 83-125, 136 >> 80-106 - 2h after lunch: n/c - before dinner: n/c >> 145 >> 154, 171 - 2h after dinner: n/c - bedtime: n/c >> 114, 146, 176, 190 >> 156-167, 180 - nighttime: n/c Lowest sugar was 96 >> 80; she has hypoglycemia awareness at 70.  Highest sugar was 132 >> 180.  Glucometer: Accu-Chek Aviva plus  Pt's meals are: - Breakfast: Oatmeal with pineapple, half a banana, raspberries, blueberries, yogurt, cottage cheese, 1 cup of tea, 1  wheat toast, feta cheese-small piece every morning - Lunch: Soup (1 cup), salad, half a cup of rice with veggies, chicken/fish and one fourth of a bowl from morning oatmeal with yogurt and 2 cookies - Dinner: Popcorn (100 cal) and 2 bottles of water - Snacks: no She saw dietitian in [place] when diagnosed in 2000.  She also saw our dietitian in 08/2018.  She was exercising consistently at [employer] by doing cardio 6 times a week, but not now during the coronavirus pandemic.  She started to exercise at home, on the treadmill.  -No CKD, last BUN/creatinine:  07/04/2018: 13/0.88, GFR 58, glucose 123 02/20/2018: ACR 4 Lab Results  Component Value Date   BUN 17 06/30/2016   BUN 14 01/13/2015   CREATININE 0.71 06/30/2016   CREATININE 0.8 05/16/2015   -+ HL; last set of lipids: 02/20/2018: 150/114/64/66 Lab Results  Component Value Date   CHOL 156 05/16/2015   HDL 70 05/16/2015   LDLCALC 86 05/16/2015   TRIG 109 05/16/2015  On Crestor 5 mg 4 times a week.  - last eye exam was in 08/2018: No DR  - no numbness and tingling in her feet.  Pt has FH of DM in mother, daughter.  She also has a history of osteopenia - diagnosed 04/2015.  She has vitamin D deficiency, psoriasis, history of PE 02/2018, colon polyp.  She was an ICU nurse, retired in 2016.  ROS: Constitutional: no weight gain/no weight loss, no fatigue, no subjective hyperthermia, no subjective  hypothermia Eyes: no blurry vision, no xerophthalmia ENT: no sore throat, no nodules palpated in neck, no dysphagia, no odynophagia, no hoarseness Cardiovascular: no CP/no SOB/no palpitations/no leg swelling Respiratory: no cough/no SOB/no wheezing Gastrointestinal: no N/no V/no D/no C/no acid reflux Musculoskeletal: no muscle aches/no joint aches Skin: no rashes, no hair loss Neurological: no tremors/no numbness/no tingling/no dizziness  I reviewed pt's medications, allergies, PMH, social hx, family hx, and changes were  documented in the history of present illness. Otherwise, unchanged from my initial visit note.  Past Medical History:  Diagnosis Date  . Diabetes mellitus without complication ([place]) 95/63/8756  . History of ear infections   . Hyperlipidemia    Past Surgical History:  Procedure Laterality Date  . TONSILLECTOMY     Social History   Socioeconomic History  . Marital status: Married    Spouse name: Not on file  . Number of children: 4 Kalman Shan, Aline August) (she also had an ectopic pregnancy)   . Years of education: Not on file  . Highest education level: Not on file  Occupational History  .  Retired ICU nurse  Social Needs  . Financial resource strain: Not on file  . Food insecurity:    Worry: Not on file    Inability: Not on file  . Transportation needs:    Medical: Not on file    Non-medical: Not on file  Tobacco Use  . Smoking status: Never Smoker  . Smokeless tobacco: Never Used  Substance and Sexual Activity  . Alcohol use:  Yes, wine, 2 drinks weekly  . Drug use: No   Current Outpatient Medications on File Prior to Visit  Medication Sig Dispense Refill  . ACCU-CHEK SOFTCLIX LANCETS lancets USE TO TEST BLOOD SUGAR TWICE DAILY. 100 each 12  . Blood Glucose Monitoring Suppl (ACCU-CHEK AVIVA PLUS) w/Device KIT USE TO TEST BLOOD SUGAR TWICE DAILY 1 kit 0  . Cholecalciferol (VITAMIN D) 2000 units CAPS Take 2,000 Units by mouth daily at 12 noon.    . Cholecalciferol (VITAMIN D3) 50 MCG (2000 UT) TABS Take by mouth.     Marland Kitchen glucose blood (ACCU-CHEK AVIVA PLUS) test strip USE TO TEST BLOOD SUGAR TWICE DAILY 100 each 12  . JARDIANCE 10 MG TABS tablet 10 mg daily.     . Multiple Vitamin (MULTIVITAMIN) tablet Take 1 tablet by mouth daily.    . NEOMYCIN-POLYMYXIN-HYDROCORTISONE (CORTISPORIN) 1 % SOLN OTIC solution 2 drops affected ear at bedtime (Patient not taking: Reported on 01/16/2019) 10 mL 2  . ONGLYZA 5 MG TABS tablet TAKE 1 TABLET DAILY 90 tablet 1  . rosuvastatin  (CRESTOR) 5 MG tablet 1 tab 4 times per week. 60 tablet 2  . vitamin B-12 (CYANOCOBALAMIN) 1000 MCG tablet Take 1,000 mcg by mouth daily.      No current facility-administered medications on file prior to visit.    Allergies  Allergen Reactions  . Ivp Dye [Iodinated Diagnostic Agents] Anaphylaxis  . Sulfa Antibiotics Anaphylaxis  . Percocet [Oxycodone-Acetaminophen] Nausea And Vomiting  . Glucophage [Metformin Hcl] Rash   Family History  Problem Relation Age of Onset  . Hypertension Father   . Diabetes Mother   . Depression Mother   . Mental illness Mother   . Kidney disease Mother   . Alzheimer's disease Mother   . Glaucoma Mother   . Breast cancer Neg Hx   Also, HTN in mother, HL in mother.  PE: BP 108/70   Pulse 71   Ht _0  (1.575  m)   Wt 131 lb (59.4 kg)   SpO2 97%   BMI 23.96 kg/m  Wt Readings from Last 3 Encounters:  03/19/19 131 lb (59.4 kg)  12/11/18 129 lb 8 oz (58.7 kg)  11/14/18 130 lb (59 kg)   Constitutional: normal weight, in NAD Eyes: PERRLA, EOMI, no exophthalmos ENT: moist mucous membranes, no thyromegaly, no cervical lymphadenopathy Cardiovascular: RRR, No MRG Respiratory: CTA B Gastrointestinal: abdomen soft, NT, ND, BS+ Musculoskeletal: no deformities, strength intact in all 4 Skin: moist, warm, no rashes Neurological: no tremor with outstretched hands, DTR normal in all 4  ASSESSMENT: 1. DM2, non-insulin-dependent, uncontrolled, without long-term complications, but with hyperglycemia  2. HL  PLAN:  1. Patient with longstanding, uncontrolled, type 2 diabetes, on oral antidiabetic regimen, now with good control.  In the past, I suggested a plant-based diet that she started to transition towards this after she saw our nutritionist during the summer.  At last visit, sugars were at goal, with only few higher than target.  We discussed about starting exercise (she has a treadmill at home), but we did not change her regimen then. -At this visit,  she mostly at goal, with few exceptions, but upon questioning, the higher blood sugars before dinner were checked closer to the previous meal.  She does not usually have a large dinner, maybe alcohol, or popcorn. -As of now, no changes are needed in management, but we discussed about continuing dietary changes and also, importantly, exercise. - I suggested to:  Patient Instructions  Please continue: - Jardiance 10 mg daily before b'fast - Onglyza 5 mg daily before b'fast  Please come back for a follow-up appointment in 4 months.  - we checked her HbA1c: 7.0% (higher) - advised to check sugars at different times of the day - 1x a day, rotating check times - advised for yearly eye exams >> she is UTD - return to clinic in 34 months  2. HL -Reviewed latest lipid panel from 2019: All fractions at goal -Continues low-dose Crestor 4 times a week without side effects -She is wondering whether she will be able to start Crestor, but I did not suggest this reminding her that statins do much more for the cardiovascular system then reduce the LDL.  She agrees to continue.   Philemon Kingdom, MD PhD [facility] Endocrinology

## 2019-03-22 ENCOUNTER — Encounter: Payer: Self-pay | Admitting: Internal Medicine

## 2019-03-22 ENCOUNTER — Encounter: Payer: Self-pay | Admitting: Family Medicine

## 2019-04-08 ENCOUNTER — Encounter: Payer: Self-pay | Admitting: Family Medicine

## 2019-04-23 ENCOUNTER — Other Ambulatory Visit: Payer: Self-pay | Admitting: Family Medicine

## 2019-04-23 ENCOUNTER — Encounter: Payer: Self-pay | Admitting: Family Medicine

## 2019-04-23 DIAGNOSIS — Z1231 Encounter for screening mammogram for malignant neoplasm of breast: Secondary | ICD-10-CM

## 2019-05-07 ENCOUNTER — Telehealth: Payer: Self-pay

## 2019-05-07 MED ORDER — ACCU-CHEK AVIVA PLUS VI STRP: ORAL_STRIP | 12 refills | Status: DC

## 2019-05-07 NOTE — Telephone Encounter (Signed)
MEDICATION: glucose blood (ACCU-CHEK AVIVA PLUS) test strip  PHARMACY:  [facility] [place], [place] AT Portal to Registered [employer] Sites  IS THIS A 90 DAY SUPPLY : yes   IS PATIENT OUT OF MEDICATION:   IF NOT; HOW MUCH IS LEFT:   LAST APPOINTMENT DATE: @11 /23/2020  NEXT APPOINTMENT DATE:@4 /08/2019  DO WE HAVE YOUR PERMISSION TO LEAVE A DETAILED MESSAGE:  OTHER COMMENTS: patient requesting 90 days because that's what her insurance pays for. Patient says she's in a research program and there supposed to give her enough to test for 2x a day and they don't    **Let patient know to contact pharmacy at the end of the day to make sure medication is ready. **  ** Please notify patient to allow 48-72 hours to process**  **Encourage patient to contact the pharmacy for refills or they can request refills through [facility]**

## 2019-05-07 NOTE — Telephone Encounter (Signed)
RX has been sent.

## 2019-05-08 ENCOUNTER — Other Ambulatory Visit: Payer: Self-pay

## 2019-05-08 MED ORDER — ACCU-CHEK AVIVA PLUS VI STRP: ORAL_STRIP | 12 refills | Status: DC

## 2019-05-08 MED ORDER — ACCU-CHEK AVIVA PLUS VI STRP: ORAL_STRIP | 0 refills | Status: DC

## 2019-05-08 NOTE — Telephone Encounter (Signed)
90 day supply of strips sent to Mail order and 25 strips sent to local pharmacy.

## 2019-05-08 NOTE — Telephone Encounter (Signed)
Made in error

## 2019-05-24 ENCOUNTER — Encounter: Payer: Self-pay | Admitting: Family Medicine

## 2019-06-10 ENCOUNTER — Encounter: Payer: Self-pay | Admitting: Internal Medicine

## 2019-06-10 DIAGNOSIS — E785 Hyperlipidemia, unspecified: Secondary | ICD-10-CM

## 2019-06-11 MED ORDER — ROSUVASTATIN CALCIUM 5 MG PO TABS: ORAL_TABLET | ORAL | 2 refills | Status: DC

## 2019-06-11 MED ORDER — JARDIANCE 10 MG PO TABS: 10.0000 mg | ORAL_TABLET | Freq: Every day | ORAL | 1 refills | Status: DC

## 2019-06-11 MED ORDER — ONGLYZA 5 MG PO TABS: 5.0000 mg | ORAL_TABLET | Freq: Every day | ORAL | 1 refills | Status: DC

## 2019-06-13 ENCOUNTER — Other Ambulatory Visit (INDEPENDENT_AMBULATORY_CARE_PROVIDER_SITE_OTHER): Payer: Medicare Other

## 2019-06-13 ENCOUNTER — Other Ambulatory Visit: Payer: Self-pay

## 2019-06-13 DIAGNOSIS — E538 Deficiency of other specified B group vitamins: Secondary | ICD-10-CM

## 2019-06-13 DIAGNOSIS — M8580 Other specified disorders of bone density and structure, unspecified site: Secondary | ICD-10-CM | POA: Diagnosis not present

## 2019-06-13 DIAGNOSIS — R7989 Other specified abnormal findings of blood chemistry: Secondary | ICD-10-CM | POA: Diagnosis not present

## 2019-06-13 DIAGNOSIS — E1169 Type 2 diabetes mellitus with other specified complication: Secondary | ICD-10-CM

## 2019-06-13 DIAGNOSIS — E119 Type 2 diabetes mellitus without complications: Secondary | ICD-10-CM

## 2019-06-13 DIAGNOSIS — E785 Hyperlipidemia, unspecified: Secondary | ICD-10-CM

## 2019-06-13 DIAGNOSIS — E559 Vitamin D deficiency, unspecified: Secondary | ICD-10-CM

## 2019-06-13 LAB — COMPREHENSIVE METABOLIC PANEL
ALT: 14 U/L (ref 0–35)
AST: 19 U/L (ref 0–37)
Albumin: 3.7 g/dL (ref 3.5–5.2)
Alkaline Phosphatase: 72 U/L (ref 39–117)
BUN: 14 mg/dL (ref 6–23)
CO2: 27 mEq/L (ref 19–32)
Calcium: 8.7 mg/dL (ref 8.4–10.5)
Chloride: 105 mEq/L (ref 96–112)
Creatinine, Ser: 0.74 mg/dL (ref 0.40–1.20)
GFR: 76.73 mL/min (ref >60.00)
Glucose, Bld: 132 mg/dL — ABNORMAL HIGH (ref 70–99)
Potassium: 4 mEq/L (ref 3.5–5.1)
Sodium: 140 mEq/L (ref 135–145)
Total Bilirubin: 0.5 mg/dL (ref 0.2–1.2)
Total Protein: 6.1 g/dL (ref 6.0–8.3)

## 2019-06-13 LAB — CBC
HCT: 44.9 % (ref 36.0–46.0)
Hemoglobin: 15.2 g/dL — ABNORMAL HIGH (ref 12.0–15.0)
MCHC: 33.7 g/dL (ref 30.0–36.0)
MCV: 91.1 fl (ref 78.0–100.0)
Platelets: 161 10*3/uL (ref 150.0–400.0)
RBC: 4.93 Mil/uL (ref 3.87–5.11)
RDW: 13.6 % (ref 11.5–15.5)
WBC: 5.5 10*3/uL (ref 4.0–10.5)

## 2019-06-13 LAB — LIPID PANEL
Cholesterol: 148 mg/dL (ref 0–200)
HDL: 62 mg/dL (ref >39.00)
LDL Cholesterol: 71 mg/dL (ref 0–99)
NonHDL: 86.3
Total CHOL/HDL Ratio: 2
Triglycerides: 78 mg/dL (ref 0.0–149.0)
VLDL: 15.6 mg/dL (ref 0.0–40.0)

## 2019-06-13 LAB — VITAMIN D 25 HYDROXY (VIT D DEFICIENCY, FRACTURES): VITD: 48.65 ng/mL (ref 30.00–100.00)

## 2019-06-13 LAB — VITAMIN B12: Vitamin B-12: 1303 pg/mL — ABNORMAL HIGH (ref 211–911)

## 2019-06-13 LAB — TSH: TSH: 3.3 u[IU]/mL (ref 0.35–4.50)

## 2019-06-15 ENCOUNTER — Telehealth: Payer: Self-pay | Admitting: Internal Medicine

## 2019-06-15 NOTE — Telephone Encounter (Signed)
Which medicine is this about?

## 2019-06-15 NOTE — Telephone Encounter (Signed)
I do not have a preference for the manufacturer.

## 2019-06-15 NOTE — Telephone Encounter (Signed)
-   Crestor 

## 2019-06-15 NOTE — Telephone Encounter (Signed)
Pharmacy called stating they do not have entericcoated tablets - asking to do accord manufacture instead? Pharm # [phone] Janace Hoard  REF# [id number]

## 2019-06-18 ENCOUNTER — Telehealth: Payer: Self-pay | Admitting: Internal Medicine

## 2019-06-18 NOTE — Telephone Encounter (Signed)
Zach with [facility] -called re: rosuvastatin (CRESTOR) 5 MG tablet is not available with enteric coated. PHARM states the patient is requesting the medication listed above be from a particular manufacturer: [employer]ord. Please call Zach at ph# [phone], Ref# [id number] to advise.

## 2019-06-18 NOTE — Telephone Encounter (Signed)
Per Dr. Cruzita Lederer ok to change.  [facility]ified [facility]

## 2019-06-20 ENCOUNTER — Encounter: Payer: Self-pay | Admitting: Family Medicine

## 2019-06-20 ENCOUNTER — Ambulatory Visit (INDEPENDENT_AMBULATORY_CARE_PROVIDER_SITE_OTHER): Payer: Medicare Other | Admitting: Family Medicine

## 2019-06-20 ENCOUNTER — Other Ambulatory Visit: Payer: Self-pay

## 2019-06-20 VITALS — BP 98/64 | HR 67 | Temp 98.0°F | Ht 62.0 in | Wt 132.2 lb

## 2019-06-20 DIAGNOSIS — E785 Hyperlipidemia, unspecified: Secondary | ICD-10-CM | POA: Diagnosis not present

## 2019-06-20 DIAGNOSIS — E119 Type 2 diabetes mellitus without complications: Secondary | ICD-10-CM

## 2019-06-20 DIAGNOSIS — E538 Deficiency of other specified B group vitamins: Secondary | ICD-10-CM | POA: Diagnosis not present

## 2019-06-20 DIAGNOSIS — E1169 Type 2 diabetes mellitus with other specified complication: Secondary | ICD-10-CM | POA: Diagnosis not present

## 2019-06-20 NOTE — Assessment & Plan Note (Signed)
Patient will stop B12 supplementation due to concern for elevated levels. Advised her to follow-up with [place] if her symptoms are not improving.

## 2019-06-20 NOTE — Patient Instructions (Signed)
It was very nice to see you today!  Come back in 6-12 months for your next visit.   Take care, Dr [name]  Please try these tips to maintain a healthy lifestyle:   Eat at least 3 REAL meals and 1-2 snacks per day.  Aim for no more than 5 hours between eating.  If you eat breakfast, please do so within one hour of getting up.    Each meal should contain half fruits/vegetables, one quarter protein, and one quarter carbs (no bigger than a computer mouse)   Cut down on sweet beverages. This includes juice, soda, and sweet tea.     Drink at least 1 glass of water with each meal and aim for at least 8 glasses per day   Exercise at least 150 minutes every week.

## 2019-06-20 NOTE — Assessment & Plan Note (Signed)
Stable.  Continue management per endocrinology. 

## 2019-06-20 NOTE — Assessment & Plan Note (Signed)
Last LDL 71.  Continue Crestor 5 mg 4 times weekly.

## 2019-06-20 NOTE — Progress Notes (Signed)
   Katherine Robinson is a 74 y.o. female who presents today for an office visit.  Assessment/Plan:  Chronic Problems Addressed Today: Low vitamin B12 level Patient will stop B12 supplementation due to concern for elevated levels. Advised her to follow-up with [place] if her symptoms are not improving.  Dyslipidemia associated with type 2 diabetes mellitus ([facility]) Last LDL 71.  Continue Crestor 5 mg 4 times weekly.  DM II (diabetes mellitus, type II), controlled ([place]) Stable.  Continue management per endocrinology.  She will follow-up in 6 to 12 months.    Subjective:  HPI:  Patient is here for follow-up visit.  She was last seen about 6 months ago.  She has had blood work done since her last visit.  She was found to have elevated B12 level.  She is concerned about possible side effects.  She has noticed occasional headache and occasional dizziness.  She stopped taking her B12 yesterday.  Does not currently have any symptoms.  No reported weakness or numbness.  She also has ongoing tinnitus.  This is a chronic problem for her.  She has seen ear nose and throat in the past and was diagnosed with inner ear canal.  Symptoms are not particular bothersome at the time being.  She is also seeing endocrinology for diabetes management since her last visit.  She has been doing well with this.       Objective:  Physical Exam: BP 98/64   Pulse 67   Temp 98 F (36.7 C)   Ht 5\' 2"  (1.575 m)   Wt 132 lb 3.2 oz (60 kg)   SpO2 98%   BMI 24.18 kg/m   Gen: No acute distress, resting comfortably HEENT: Bilateral bony exostosis noted.  TMs clear. CV: Regular rate and rhythm with no murmurs appreciated Pulm: Normal work of breathing, clear to auscultation bilaterally with no crackles, wheezes, or rhonchi Neuro: Grossly normal, moves all extremities Psych: Normal affect and thought content      Birtie Fellman M. [name], MD 06/20/2019 11:57 AM

## 2019-07-12 ENCOUNTER — Telehealth: Payer: Self-pay | Admitting: Family Medicine

## 2019-07-12 NOTE — Telephone Encounter (Signed)
Pt will call back after talking with spouse to get scheduled for Medicare Annual Wellness Visit (AWV) either virtually/audio only OR in office. Whatever the patients preference is.  Last AWV 9.26.18; please schedule at anytime with [facility] Health Advisor at [facility].

## 2019-07-26 ENCOUNTER — Other Ambulatory Visit: Payer: Self-pay

## 2019-07-30 ENCOUNTER — Ambulatory Visit (INDEPENDENT_AMBULATORY_CARE_PROVIDER_SITE_OTHER): Payer: Medicare Other | Admitting: Internal Medicine

## 2019-07-30 ENCOUNTER — Other Ambulatory Visit: Payer: Self-pay

## 2019-07-30 ENCOUNTER — Encounter: Payer: Self-pay | Admitting: Internal Medicine

## 2019-07-30 VITALS — BP 102/60 | HR 61 | Ht 62.0 in | Wt 131.0 lb

## 2019-07-30 DIAGNOSIS — E1169 Type 2 diabetes mellitus with other specified complication: Secondary | ICD-10-CM

## 2019-07-30 DIAGNOSIS — E1165 Type 2 diabetes mellitus with hyperglycemia: Secondary | ICD-10-CM | POA: Diagnosis not present

## 2019-07-30 DIAGNOSIS — E785 Hyperlipidemia, unspecified: Secondary | ICD-10-CM | POA: Diagnosis not present

## 2019-07-30 LAB — POCT GLYCOSYLATED HEMOGLOBIN (HGB A1C): Hemoglobin A1C: 7.4 % — AB (ref 4.0–5.6)

## 2019-07-30 NOTE — Patient Instructions (Addendum)
Please continue: - Jardiance 10 mg daily before b'fast - Onglyza 5 mg daily before b'fast  Please stop at the lab.  Please come back for a follow-up appointment in 4 months. 

## 2019-07-30 NOTE — Progress Notes (Addendum)
Patient ID: Katherine Robinson, female   DOB: 06-22-1945, 74 y.o.   MRN: [id number]   This visit occurred during the SARS-CoV-2 public health emergency.  Safety protocols were in place, including screening questions prior to the visit, additional usage of staff PPE, and extensive cleaning of exam room while observing appropriate contact time as indicated for disinfecting solutions.   HPI: Katherine Robinson is a 74 y.o.-year-old female, initially referred by her PCP, Dr.Aasmaa, returning for follow-up for DM2, dx in 2000, non-insulin-dependent, fairly well controlled, without long-term complications.  Last visit 4.5 months ago. She moved to the area from [place].  Sugars started to improve after she started to eat a more plant-based diet last year.  She had both Covid vaccine doses - Pfizer.  Reviewed HbA1c levels: Lab Results  Component Value Date   HGBA1C 7.0 (A) 03/19/2019   HGBA1C 6.9 (A) 11/14/2018   HGBA1C 7.1 (H) 06/30/2016   HGBA1C 6.9 01/13/2015   HGBA1C 6.8 09/02/2014  07/04/2018: HbA1c 7.0% 02/20/2018: HbA1c 7.1% 09/2016: C-peptide 1.46  She is on: - Onglyza 5 mg at night >> before breakfast - Jardiance 10 mg before breakfast She was on metformin until 2014 when she developed intolerance: "Bitter mouth".  She tried metformin ER but this caused generalized itching, rash, with generalized pain all over her body. She tried Invokana.   Pt checks her sugars once a day: - am: 96-115, 132 >> 111-129 >> 102-132 >> 115-139, 144 - 2h after b'fast: n/c - before lunch: n/c >> 79, 83-125, 136 >> 80-106 >> 80-111 - 2h after lunch: n/c - before dinner: n/c >> 145 >> 154, 171 >> n/c - 2h after dinner: n/c - bedtime: 114, 146, 176, 190 >> 156-167, 180 >> 161, 184, 208 - nighttime: n/c Lowest sugar was 96 >> 80 >> 80; she has hypoglycemia awareness at 74.  Highest sugar was 132 >> 180 >> .  Glucometer: Accu-Chek Aviva plus  Pt's meals are: - Breakfast: Oatmeal with pineapple,  half a banana, raspberries, blueberries, yogurt, cottage cheese, 1 cup of tea, 1 wheat toast, feta cheese-small piece every morning - Lunch: Soup (1 cup), salad, half a cup of rice with veggies, chicken/fish and one fourth of a bowl from morning oatmeal with yogurt and 2 cookies - Dinner: Popcorn (100 cal) or crackers and 2 bottles of water - Snacks: no She saw dietitian in [place] when diagnosed in 2000.  She also saw our dietitian in 08/2018.  She was exercising consistently at [employer] by doing cardio 6 times a week, but not now during the coronavirus pandemic.  Before last visit she started to exercise at home, on the treadmill.  -No CKD, last BUN/creatinine:  Lab Results  Component Value Date   BUN 14 06/13/2019   BUN 17 06/30/2016   CREATININE 0.74 06/13/2019   CREATININE 0.71 06/30/2016  07/04/2018: 13/0.88, GFR 58, glucose 123 02/20/2018: ACR 4  -+ HL; last set of lipids: Lab Results  Component Value Date   CHOL 148 06/13/2019   HDL 62.00 06/13/2019   LDLCALC 71 06/13/2019   TRIG 78.0 06/13/2019   CHOLHDL 2 06/13/2019  02/20/2018: 150/114/64/66 On Crestor 5 mg 4 times a week  - last eye exam was in 08/2018: No DR  - no numbness and tingling in her feet.  Pt has FH of DM in mother, daughter.  She also has a history of osteopenia - diagnosed 04/2015.  She has vitamin D deficiency, psoriasis, history of PE  02/2018, colon polyp.  She was an ICU nurse, retired in 2016.  She stopped B12 in 05/2019 as her B12 was too high.  ROS: Constitutional: no weight gain/no weight loss, no fatigue, no subjective hyperthermia, no subjective hypothermia Eyes: no blurry vision, no xerophthalmia ENT: no sore throat, no nodules palpated in neck, no dysphagia, no odynophagia, no hoarseness Cardiovascular: no CP/no SOB/no palpitations/no leg swelling Respiratory: no cough/no SOB/no wheezing Gastrointestinal: no N/no V/no D/no C/no acid reflux Musculoskeletal: no muscle aches/no  joint aches Skin: no rashes, no hair loss Neurological: no tremors/no numbness/no tingling/+ dizziness >> resolved after stopping B12.  I reviewed pt's medications, allergies, PMH, social hx, family hx, and changes were documented in the history of present illness. Otherwise, unchanged from my initial visit note.  Past Medical History:  Diagnosis Date  . Diabetes mellitus without complication ([place]) 41/63/8453  . History of ear infections   . Hyperlipidemia    Past Surgical History:  Procedure Laterality Date  . TONSILLECTOMY     Social History   Socioeconomic History  . Marital status: Married    Spouse name: Not on file  . Number of children: 4 Kalman Shan, Aline August) (she also had an ectopic pregnancy)   . Years of education: Not on file  . Highest education level: Not on file  Occupational History  .  Retired ICU nurse  Social Needs  . Financial resource strain: Not on file  . Food insecurity:    Worry: Not on file    Inability: Not on file  . Transportation needs:    Medical: Not on file    Non-medical: Not on file  Tobacco Use  . Smoking status: Never Smoker  . Smokeless tobacco: Never Used  Substance and Sexual Activity  . Alcohol use:  Yes, wine, 2 drinks weekly  . Drug use: No   Current Outpatient Medications on File Prior to Visit  Medication Sig Dispense Refill  . ACCU-CHEK SOFTCLIX LANCETS lancets USE TO TEST BLOOD SUGAR TWICE DAILY. 100 each 12  . Blood Glucose Monitoring Suppl (ACCU-CHEK AVIVA PLUS) w/Device KIT USE TO TEST BLOOD SUGAR TWICE DAILY 1 kit 0  . Cholecalciferol (VITAMIN D3) 50 MCG (2000 UT) TABS Take by mouth.     Marland Kitchen glucose blood (ACCU-CHEK AVIVA PLUS) test strip USE TO TEST BLOOD SUGAR TWICE DAILY 200 each 12  . JARDIANCE 10 MG TABS tablet Take 10 mg by mouth daily. 90 tablet 1  . Multiple Vitamin (MULTIVITAMIN) tablet Take 1 tablet by mouth daily.    . NEOMYCIN-POLYMYXIN-HYDROCORTISONE (CORTISPORIN) 1 % SOLN OTIC solution 2 drops  affected ear at bedtime 10 mL 2  . ONGLYZA 5 MG TABS tablet Take 1 tablet (5 mg total) by mouth daily. 90 tablet 1  . rosuvastatin (CRESTOR) 5 MG tablet 1 tab 4 times per week. 60 tablet 2   No current facility-administered medications on file prior to visit.   Allergies  Allergen Reactions  . Ivp Dye [Iodinated Diagnostic Agents] Anaphylaxis  . Sulfa Antibiotics Anaphylaxis  . Percocet [Oxycodone-Acetaminophen] Nausea And Vomiting  . Glucophage [Metformin Hcl] Rash   Family History  Problem Relation Age of Onset  . Hypertension Father   . Diabetes Mother   . Depression Mother   . Mental illness Mother   . Kidney disease Mother   . Alzheimer's disease Mother   . Glaucoma Mother   . Breast cancer Neg Hx   Also, HTN in mother, HL in mother.  PE: BP 102/60  Pulse 61   Ht 5' 2"  (1.575 m)   Wt 131 lb (59.4 kg)   SpO2 97%   BMI 23.96 kg/m  Wt Readings from Last 3 Encounters:  07/30/19 131 lb (59.4 kg)  06/20/19 132 lb 3.2 oz (60 kg)  03/19/19 131 lb (59.4 kg)   Constitutional: normal weight, in NAD Eyes: PERRLA, EOMI, no exophthalmos ENT: moist mucous membranes, no thyromegaly, no cervical lymphadenopathy Cardiovascular: RRR, No MRG Respiratory: CTA B Gastrointestinal: abdomen soft, NT, ND, BS+ Musculoskeletal: no deformities, strength intact in all 4 Skin: moist, warm, no rashes Neurological: no tremor with outstretched hands, DTR normal in all 4  ASSESSMENT: 1. DM2, non-insulin-dependent, uncontrolled, without long-term complications, but with hyperglycemia  2. HL  PLAN:  1. Patient with longstanding, uncontrolled, type 2 diabetes, on oral antidiabetic regimen, now with good control. In the past I suggested a plant-based diet and she started to transition to first visit last year.  Sugars improved with only few higher than target at last visit.  The ones that were higher were checked closer to the previous meal.  She was occasionally having snacks (popcorn) or  alcohol but the vast majority of the sugars were at goal so we did not change her regimen at last visit. -At this visit, she mostly check her blood sugars in the morning and at lunchtime and these are at goal with very few mild hypoglycemic CBGs upon waking up in the morning.  However, she is not checking sugars later in the day, around dinnertime but whenever she checked (2 times at the beginning of last month), these were slightly higher.  She had 1 high blood sugar in the 200s then.  We discussed that her sugars are at goal but we may miss some of the hyperglycemic CBGs in the second half of the day and I strongly advised her to start checking sugars around these times, also. We checked her HbA1c: 7.4% (higher), but not correlating with the sugars.  Since there is a discrepancy between the HbA1c and her sugars at home, we will also check a fructosamine level.  I do not feel we need to change her regimen for now but we do have room to increase her Onglyza or Jardiance, give HbA1c calculated from fructosamine is still high. - I suggested to:  Patient Instructions  Please continue: - Jardiance 10 mg daily before b'fast - Onglyza 5 mg daily before b'fast  Please come back for a follow-up appointment in 4 months.  - advised to check sugars at different times of the day - 1x a day, rotating check times - advised for yearly eye exams >> she is UTD - return to clinic in 4 months  2. HL - Reviewed latest lipid panel from 2 months ago: All fractions at goal Lab Results  Component Value Date   CHOL 148 06/13/2019   HDL 62.00 06/13/2019   LDLCALC 71 06/13/2019   TRIG 78.0 06/13/2019   CHOLHDL 2 06/13/2019  -Continues the statin: Low-dose Crestor 4 times a week without side effects -At last visit, she was wondering whether she could come off Crestor, but I did not suggest this reminding her that statins do much more for the cardiovascular system then reduce the LDL.  She agreed to continue.  Office  Visit on 07/30/2019  Component Date Value Ref Range Status  . Fructosamine 07/30/2019 262  205 - 285 umol/L Final  . Hemoglobin A1C 07/30/2019 7.4* 4.0 - 5.6 % Final   HbA1c calculated  from fructosamine is actually much better, at 6.06%.  Philemon Kingdom, MD PhD [facility] Endocrinology

## 2019-07-30 NOTE — Addendum Note (Signed)
Addended by: Cardell Peach I on: 07/30/2019 01:36 PM   Modules accepted: Orders

## 2019-08-02 LAB — FRUCTOSAMINE: Fructosamine: 262 umol/L (ref 205–285)

## 2019-08-03 DIAGNOSIS — E119 Type 2 diabetes mellitus without complications: Secondary | ICD-10-CM | POA: Diagnosis not present

## 2019-08-03 DIAGNOSIS — H25813 Combined forms of age-related cataract, bilateral: Secondary | ICD-10-CM | POA: Diagnosis not present

## 2019-08-03 DIAGNOSIS — Z7984 Long term (current) use of oral hypoglycemic drugs: Secondary | ICD-10-CM | POA: Diagnosis not present

## 2019-08-20 ENCOUNTER — Ambulatory Visit
Admission: RE | Admit: 2019-08-20 | Discharge: 2019-08-20 | Disposition: A | Discharge status: Home / Self Care | Payer: Medicare Other | Source: Ambulatory Visit | Attending: Family Medicine | Admitting: Family Medicine

## 2019-08-20 ENCOUNTER — Other Ambulatory Visit: Payer: Self-pay | Admitting: Family Medicine

## 2019-08-20 ENCOUNTER — Other Ambulatory Visit: Payer: Self-pay

## 2019-08-20 DIAGNOSIS — Z1231 Encounter for screening mammogram for malignant neoplasm of breast: Secondary | ICD-10-CM

## 2019-12-11 ENCOUNTER — Ambulatory Visit (INDEPENDENT_AMBULATORY_CARE_PROVIDER_SITE_OTHER): Payer: Medicare Other | Admitting: Internal Medicine

## 2019-12-11 ENCOUNTER — Encounter: Payer: Self-pay | Admitting: Internal Medicine

## 2019-12-11 ENCOUNTER — Other Ambulatory Visit: Payer: Self-pay

## 2019-12-11 VITALS — BP 112/62 | HR 63 | Ht 62.0 in | Wt 130.0 lb

## 2019-12-11 DIAGNOSIS — E1165 Type 2 diabetes mellitus with hyperglycemia: Secondary | ICD-10-CM

## 2019-12-11 DIAGNOSIS — E1169 Type 2 diabetes mellitus with other specified complication: Secondary | ICD-10-CM | POA: Diagnosis not present

## 2019-12-11 DIAGNOSIS — E785 Hyperlipidemia, unspecified: Secondary | ICD-10-CM

## 2019-12-11 LAB — POCT GLYCOSYLATED HEMOGLOBIN (HGB A1C): Hemoglobin A1C: 7.4 % — AB (ref 4.0–5.6)

## 2019-12-11 MED ORDER — ROSUVASTATIN CALCIUM 5 MG PO TABS: ORAL_TABLET | ORAL | 3 refills | Status: DC

## 2019-12-11 MED ORDER — JARDIANCE 10 MG PO TABS: 10.0000 mg | ORAL_TABLET | Freq: Every day | ORAL | 3 refills | Status: DC

## 2019-12-11 MED ORDER — ONGLYZA 5 MG PO TABS: 5.0000 mg | ORAL_TABLET | Freq: Every day | ORAL | 3 refills | Status: DC

## 2019-12-11 NOTE — Progress Notes (Addendum)
Patient ID: Katherine Robinson, female   DOB: [birth year], 74 y.o.   MRN: [id number]   This visit occurred during the SARS-CoV-2 public health emergency.  Safety protocols were in place, including screening questions prior to the visit, additional usage of staff PPE, and extensive cleaning of exam room while observing appropriate contact time as indicated for disinfecting solutions.   HPI: Katherine Robinson is a 74 y.o.-year-old female, initially referred by her PCP, Dr.Aasmaa, returning for follow-up for DM2, dx in 2000, non-insulin-dependent, fairly well controlled, without long-term complications.  Last visit 4.5 months ago. She moved to the area from [place].  Sugars started to improve after she started to eat a more plant-based diet last year.  Reviewed HbA1c levels: 07/30/2019: HbA1c calculated from fructosamine is actually much better, at 6.06%. Lab Results  Component Value Date   HGBA1C 7.4 (A) 07/30/2019   HGBA1C 7.0 (A) 03/19/2019   HGBA1C 6.9 (A) 11/14/2018   HGBA1C 7.1 (H) 06/30/2016   HGBA1C 6.9 01/13/2015   HGBA1C 6.8 09/02/2014  07/04/2018: HbA1c 7.0% 02/20/2018: HbA1c 7.1% 09/2016: C-peptide 1.46  She is on: - Onglyza 5 mg at night >> before breakfast - Jardiance and mg before breakfast She was on metformin until 2014 when she developed intolerance: "Bitter mouth".  She tried metformin ER but this caused generalized itching, rash, with generalized pain all over her body. She tried Invokana.   Pt checks her sugars once a day: - am: 111-129 >> 102-132 >> 115-139, 144 >> 127-131 - 2h after b'fast: n/c - before lunch: 79, 83-125, 136 >> 80-106 >> 80-111 >> 74-94 - 2h after lunch: n/c - before dinner: n/c >> 145 >> 154, 171 >> n/c >> 135, 167 - 2h after dinner: n/c - bedtime: 156-167, 180 >> 161, 184, 208 >> 159, 160, 205, 220 (pain), 236 (pizza) - nighttime: n/c Lowest sugar was 96 >> 80 >> 80 >> 78; she has hypoglycemia awareness at 70.  Highest sugar was 132 >> 180  >> 200 (pizza).  Glucometer: Accu-Chek Aviva plus  Pt's meals are: - Breakfast: Oatmeal with pineapple, half a banana, raspberries, blueberries, yogurt, cottage cheese, 1 cup of tea, 1 wheat toast, feta cheese-small piece every morning - Lunch: Soup (1/2 cup), salad, half a cup of rice with veggies, chicken/fish and one fourth of a bowl from morning oatmeal with yogurt and 2 cookies - Dinner: Popcorn (100 cal) or crackers and 2 bottles of water - Snacks: no She saw dietitian in [place] when diagnosed in 2000.  She also saw our dietitian in 08/2018.  She was exercising consistently at [employer] by doing cardio 6 times a week, but stopped during the coronavirus pandemic.  She restarted exercise at home, on the treadmill. Now going back to the gym but has significant L shoulder pain.  -No CKD, last BUN/creatinine:  Lab Results  Component Value Date   BUN 14 06/13/2019   BUN 17 06/30/2016   CREATININE 0.74 06/13/2019   CREATININE 0.71 06/30/2016  07/04/2018: 13/0.88, GFR 58, glucose 123 02/20/2018: ACR 4  -+ HL; last set of lipids: Lab Results  Component Value Date   CHOL 148 06/13/2019   HDL 62.00 06/13/2019   LDLCALC 71 06/13/2019   TRIG 78.0 06/13/2019   CHOLHDL 2 06/13/2019  02/20/2018: 150/114/64/66 On Crestor 5 mg 4 times a week.  - last eye exam was in 03/2019: No DR - no numbness and tingling in her feet.  Pt has FH of DM in mother,  daughter.  She also has a history of osteopenia - diagnosed 04/2015.  She has vitamin D deficiency, psoriasis, history of PE 02/2018, colon polyp.  She was an ICU nurse, retired in 2016.  She stopped B12 in 05/2019 as her B12 was too high and she developed dizziness at that time, resolved after stopping B12.  ROS: Constitutional: no weight gain/no weight loss, no fatigue, no subjective hyperthermia, no subjective hypothermia Eyes: no blurry vision, no xerophthalmia ENT: no sore throat, no nodules palpated in neck, no dysphagia,  no odynophagia, no hoarseness Cardiovascular: no CP/no SOB/no palpitations/no leg swelling Respiratory: no cough/no SOB/no wheezing Gastrointestinal: no N/no V/no D/no C/no acid reflux Musculoskeletal: no muscle aches/+ joint aches - L shoulder Skin: no rashes, no hair loss Neurological: no tremors/no numbness/no tingling/no dizziness  I reviewed pt's medications, allergies, PMH, social hx, family hx, and changes were documented in the history of present illness. Otherwise, unchanged from my initial visit note.  Past Medical History:  Diagnosis Date  . Diabetes mellitus without complication ([place]) 27/25/3664  . History of ear infections   . Hyperlipidemia    Past Surgical History:  Procedure Laterality Date  . TONSILLECTOMY     Social History   Socioeconomic History  . Marital status: Married    Spouse name: Not on file  . Number of children: 4 Kalman Shan, Aline August) (she also had an ectopic pregnancy)   . Years of education: Not on file  . Highest education level: Not on file  Occupational History  .  Retired ICU nurse  Social Needs  . Financial resource strain: Not on file  . Food insecurity:    Worry: Not on file    Inability: Not on file  . Transportation needs:    Medical: Not on file    Non-medical: Not on file  Tobacco Use  . Smoking status: Never Smoker  . Smokeless tobacco: Never Used  Substance and Sexual Activity  . Alcohol use:  Yes, wine, 2 drinks weekly  . Drug use: No   Current Outpatient Medications on File Prior to Visit  Medication Sig Dispense Refill  . ACCU-CHEK SOFTCLIX LANCETS lancets USE TO TEST BLOOD SUGAR TWICE DAILY. 100 each 12  . Blood Glucose Monitoring Suppl (ACCU-CHEK AVIVA PLUS) w/Device KIT USE TO TEST BLOOD SUGAR TWICE DAILY 1 kit 0  . Cholecalciferol (VITAMIN D3) 50 MCG (2000 UT) TABS Take by mouth.     Marland Kitchen glucose blood (ACCU-CHEK AVIVA PLUS) test strip USE TO TEST BLOOD SUGAR TWICE DAILY 200 each 12  . JARDIANCE 10 MG TABS  tablet Take 10 mg by mouth daily. 90 tablet 1  . Multiple Vitamin (MULTIVITAMIN) tablet Take 1 tablet by mouth daily.    . NEOMYCIN-POLYMYXIN-HYDROCORTISONE (CORTISPORIN) 1 % SOLN OTIC solution 2 drops affected ear at bedtime 10 mL 2  . ONGLYZA 5 MG TABS tablet Take 1 tablet (5 mg total) by mouth daily. 90 tablet 1  . rosuvastatin (CRESTOR) 5 MG tablet 1 tab 4 times per week. 60 tablet 2   No current facility-administered medications on file prior to visit.   Allergies  Allergen Reactions  . Ivp Dye [Iodinated Diagnostic Agents] Anaphylaxis  . Sulfa Antibiotics Anaphylaxis  . Percocet [Oxycodone-Acetaminophen] Nausea And Vomiting  . Glucophage [Metformin Hcl] Rash   Family History  Problem Relation Age of Onset  . Hypertension Father   . Diabetes Mother   . Depression Mother   . Mental illness Mother   . Kidney disease Mother   .  Alzheimer's disease Mother   . Glaucoma Mother   . Breast cancer Neg Hx   Also, HTN in mother, HL in mother.  PE: BP 112/62   Pulse 63   Ht _0  (1.575 m)   Wt 130 lb (59 kg)   SpO2 97%   BMI 23.78 kg/m  Wt Readings from Last 3 Encounters:  12/11/19 130 lb (59 kg)  07/30/19 131 lb (59.4 kg)  06/20/19 132 lb 3.2 oz (60 kg)   Constitutional: normal weight, in NAD Eyes: PERRLA, EOMI, no exophthalmos ENT: moist mucous membranes, no thyromegaly, no cervical lymphadenopathy Cardiovascular: RRR, No MRG Respiratory: CTA B Gastrointestinal: abdomen soft, NT, ND, BS+ Musculoskeletal: no deformities, strength intact in all 4 Skin: moist, warm, no rashes Neurological: no tremor with outstretched hands, DTR normal in all 4  ASSESSMENT: 1. DM2, non-insulin-dependent, uncontrolled, without long-term complications, but with hyperglycemia  2. HL  PLAN:  1. Patient with longstanding, uncontrolled, type 2 diabetes, on oral antidiabetic regimen, with improved control.  She started to adopt a more plant-based diet last year and her sugars improved  afterwards.  At last visit, she was mostly checking her blood sugars in the morning and at lunchtime and these were at goal, with very few mild hyperglycemic CBGs upon waking up in the morning.  However, she was not checking sugars later in the day consistently, but whenever she was checking, these are slightly higher.  I advised her to start checking sugars later in the day more frequently.  At that time, directly measured HbA1c was 7.4%, but this was not correlating well with her sugars at home.  We fructosamine level and the HbA1c calculated from fructosamine was actually excellent, at 6.06%.  We did not change her regimen at that time. -At this visit, sugars have better in the morning and before lunch, that but some of her bedtime sugars are higher, even in the 200s.  1 of these values was checked after eating pizza, and she feels that the rest of them were due to pain in her left shoulder.  She has an exacerbation and she has problems exercising because of this.  For now, I would suggest to continue the current regimen but if she continues to have sugars above target at next visit, we may need to intensify her regimen.  She does continue to have a good diet, we did not change much since last visit. - I suggested to:  Patient Instructions  Please continue: - Jardiance 10 mg daily before b'fast - Onglyza 5 mg daily before b'fast  Please come back for a follow-up appointment in 4 months.  - we checked her HbA1c: 7.4% (will also check a fructosamine since this appears to be more accurate for her) - advised to check sugars at different times of the day - 1x a day, rotating check times - advised for yearly eye exams >> she is UTD - return to clinic in 4 months  2. HL -Reviewed latest lipid panel from 05/2019: All fractions at goal: Lab Results  Component Value Date   CHOL 148 06/13/2019   HDL 62.00 06/13/2019   LDLCALC 71 06/13/2019   TRIG 78.0 06/13/2019   CHOLHDL 2 06/13/2019  -She continues  on low-dose Crestor 4 times a week without side effects. -We discussed in the past about the importance of continuing Crestor not only for lowering LDL but also cholesterol plaque stabilization  Office Visit on 12/11/2019  Component Date Value Ref Range Status  . Fructosamine  12/11/2019 269  205 - 285 umol/L Final  . Hemoglobin A1C 12/11/2019 7.4* 4.0 - 5.6 % Final   HbA1c calculated from fructosamine is 6.18%, better than the one directly measured.  Philemon Kingdom, MD PhD [facility] Endocrinology

## 2019-12-11 NOTE — Patient Instructions (Signed)
Please continue: - Jardiance 10 mg daily before b'fast - Onglyza 5 mg daily before b'fast  Please stop at the lab.  Please come back for a follow-up appointment in 4 months.

## 2019-12-11 NOTE — Addendum Note (Signed)
Addended by: Cardell Peach I on: 12/11/2019 03:20 PM   Modules accepted: Orders

## 2019-12-13 LAB — FRUCTOSAMINE: Fructosamine: 269 umol/L (ref 205–285)

## 2020-01-23 DIAGNOSIS — Z23 Encounter for immunization: Secondary | ICD-10-CM | POA: Diagnosis not present

## 2020-01-25 DIAGNOSIS — Z23 Encounter for immunization: Secondary | ICD-10-CM | POA: Diagnosis not present

## 2020-02-06 DIAGNOSIS — Z20822 Contact with and (suspected) exposure to covid-19: Secondary | ICD-10-CM | POA: Diagnosis not present

## 2020-04-11 ENCOUNTER — Ambulatory Visit: Payer: Medicare Other | Admitting: Internal Medicine

## 2020-05-09 ENCOUNTER — Ambulatory Visit: Payer: Medicare Other | Admitting: Internal Medicine

## 2020-05-13 ENCOUNTER — Other Ambulatory Visit: Payer: Self-pay

## 2020-05-13 NOTE — Progress Notes (Signed)
Patient ID: Katherine Robinson, female   DOB: [birth year], 75 y.o.   MRN: [id number]   This visit occurred during the SARS-CoV-2 public health emergency.  Safety protocols were in place, including screening questions prior to the visit, additional usage of staff PPE, and extensive cleaning of exam room while observing appropriate contact time as indicated for disinfecting solutions.   HPI: Katherine Robinson is a 75 y.o.-year-old female, initially referred by her PCP, Dr.Aasmaa, returning for follow-up for DM2, dx in 2000, non-insulin-dependent, fairly well controlled, without long-term complications.  Last visit 5 months ago. She moved to the area from [place].  Sugars started to improve after she started to eat a more plant-based diet in the last 2 years.  In the last month sugars were higher after traveling to [place] and [place] and also having had a URI when she returned (she did not get tested for [place]).  Reviewed HbA1c levels: 12/11/2019: HbA1c calculated from fructosamine is 6.18%, better than the one directly measured. Lab Results  Component Value Date   HGBA1C 7.4 (A) 12/11/2019   HGBA1C 7.4 (A) 07/30/2019   HGBA1C 7.0 (A) 03/19/2019   HGBA1C 6.9 (A) 11/14/2018   HGBA1C 7.1 (H) 06/30/2016   HGBA1C 6.9 01/13/2015   HGBA1C 6.8 09/02/2014  07/30/2019: HbA1c calculated from fructosamine is actually much better, at 6.06%. 07/04/2018: HbA1c 7.0% 02/20/2018: HbA1c 7.1% 09/2016: C-peptide 1.46  She is on: - Onglyza 5 mg at night >> before breakfast - Jardiance 10 mg before breakfast She was on metformin until 2014 when she developed intolerance: "Bitter mouth".  She tried metformin ER but this caused generalized itching, rash, with generalized pain all over her body. She tried Invokana.   Pt checks her sugars once a day per review of her meter download: - am: 111-129 >> 102-132 >> 115-139, 144 >> 127-131 >> 98-132 - 2h after b'fast: n/c >> 127 - before lunch: 80-106 >> 80-111 >>  74-94 >> 115, 173, 214 - 2h after lunch: n/c - before dinner: n/c >> 145 >> 154, 171 >> n/c >> 135, 167 >> 103 - 2h after dinner: n/c - bedtime:  161, 184, 208 >> 159, 160, 205, 220 (pain), 236 (pizza) >> 189, 202 - nighttime: n/c Lowest sugar was 96 >> 80 >> 80 >> 78 >> 98; she has hypoglycemia awareness in the 70s.  Highest sugar was 132 >> 180 >> 200 (pizza) >> 202.  Glucometer: Accu-Chek Aviva plus  Pt's meals are: - Breakfast: Oatmeal with pineapple, half a banana, raspberries, blueberries, yogurt, cottage cheese, 1 cup of tea, 1 wheat toast, feta cheese-small piece every morning - Lunch: Soup (1/2 cup), salad, half a cup of rice with veggies, chicken/fish and one fourth of a bowl from morning oatmeal with yogurt and 2 cookies - Dinner: Popcorn (100 cal) or crackers and 2 bottles of water - Snacks: no She saw dietitian in [place] when diagnosed in 2000.  She also saw our dietitian in 08/2018.  She was exercising consistently at [employer] by doing cardio 6 times a week, but stopped during the coronavirus pandemic.  She started exercising at home on the treadmill, but before last visit she returned to the gym.  However, she was experiencing left shoulder pain. As of now, she again exercises consistently, qod.  -No CKD, last BUN/creatinine:  Lab Results  Component Value Date   BUN 14 06/13/2019   BUN 17 06/30/2016   CREATININE 0.74 06/13/2019   CREATININE 0.71 06/30/2016  07/04/2018:  13/0.88, GFR 58, glucose 123 02/20/2018: ACR 4  -+ HL; last set of lipids: Lab Results  Component Value Date   CHOL 148 06/13/2019   HDL 62.00 06/13/2019   LDLCALC 71 06/13/2019   TRIG 78.0 06/13/2019   CHOLHDL 2 06/13/2019  02/20/2018: 150/114/64/66 She was on Crestor 5 mg 4 times a week >> stopped 2/2 bitter taste in month. She tried Simvastatin in the past >> leg cramps.  - last eye exam was in 01/2020: No DR reportedly - no numbness and tingling in her feet.  Pt has FH of DM in  mother, daughter.  She also has a history of osteopenia - diagnosed 04/2015.  She has vitamin D deficiency, psoriasis, history of PE 02/2018, colon polyp.  She was an ICU nurse, retired in 2016.  She stopped B12 in 05/2019 as her B12 was too high and she developed dizziness at that time, resolved after stopping B12.  ROS: Constitutional: no weight gain/no weight loss, no fatigue, no subjective hyperthermia, no subjective hypothermia Eyes: no blurry vision, no xerophthalmia ENT: no sore throat, no nodules palpated in neck, no dysphagia, no odynophagia, no hoarseness Cardiovascular: no CP/no SOB/no palpitations/no leg swelling Respiratory: no cough/no SOB/no wheezing Gastrointestinal: no N/no V/no D/no C/no acid reflux Musculoskeletal: no muscle aches/+ joint aches Skin: no rashes, no hair loss Neurological: no tremors/no numbness/no tingling/no dizziness  I reviewed pt's medications, allergies, PMH, social hx, family hx, and changes were documented in the history of present illness. Otherwise, unchanged from my initial visit note.  Past Medical History:  Diagnosis Date  . Diabetes mellitus without complication ([place]) 38/75/6433  . History of ear infections   . Hyperlipidemia    Past Surgical History:  Procedure Laterality Date  . TONSILLECTOMY     Social History   Socioeconomic History  . Marital status: Married    Spouse name: Not on file  . Number of children: 4 Katherine Robinson, Katherine Robinson August) (she also had an ectopic pregnancy)   . Years of education: Not on file  . Highest education level: Not on file  Occupational History  .  Retired ICU nurse  Social Needs  . Financial resource strain: Not on file  . Food insecurity:    Worry: Not on file    Inability: Not on file  . Transportation needs:    Medical: Not on file    Non-medical: Not on file  Tobacco Use  . Smoking status: Never Smoker  . Smokeless tobacco: Never Used  Substance and Sexual Activity  . Alcohol use:   Yes, wine, 2 drinks weekly  . Drug use: No   Current Outpatient Medications on File Prior to Visit  Medication Sig Dispense Refill  . ACCU-CHEK SOFTCLIX LANCETS lancets USE TO TEST BLOOD SUGAR TWICE DAILY. 100 each 12  . Blood Glucose Monitoring Suppl (ACCU-CHEK AVIVA PLUS) w/Device KIT USE TO TEST BLOOD SUGAR TWICE DAILY 1 kit 0  . Cholecalciferol (VITAMIN D3) 50 MCG (2000 UT) TABS Take by mouth.     Marland Kitchen glucose blood (ACCU-CHEK AVIVA PLUS) test strip USE TO TEST BLOOD SUGAR TWICE DAILY 200 each 12  . JARDIANCE 10 MG TABS tablet Take 1 tablet (10 mg total) by mouth daily. 90 tablet 3  . Multiple Vitamin (MULTIVITAMIN) tablet Take 1 tablet by mouth daily.    . NEOMYCIN-POLYMYXIN-HYDROCORTISONE (CORTISPORIN) 1 % SOLN OTIC solution 2 drops affected ear at bedtime 10 mL 2  . ONGLYZA 5 MG TABS tablet Take 1 tablet (5 mg total)  by mouth daily. 90 tablet 3  . rosuvastatin (CRESTOR) 5 MG tablet 1 tab 4 times per week. 60 tablet 3   No current facility-administered medications on file prior to visit.   Allergies  Allergen Reactions  . Ivp Dye [Iodinated Diagnostic Agents] Anaphylaxis  . Sulfa Antibiotics Anaphylaxis  . Percocet [Oxycodone-Acetaminophen] Nausea And Vomiting  . Glucophage [Metformin Hcl] Rash   Family History  Problem Relation Age of Onset  . Hypertension Father   . Diabetes Mother   . Depression Mother   . Mental illness Mother   . Kidney disease Mother   . Alzheimer's disease Mother   . Glaucoma Mother   . Breast cancer Neg Hx   Also, HTN in mother, HL in mother.  PE: BP 100/66   Pulse 70   Ht 5' 2"  (1.575 m)   Wt 127 lb 8 oz (57.8 kg)   SpO2 98%   BMI 23.32 kg/m  Wt Readings from Last 3 Encounters:  05/14/20 127 lb 8 oz (57.8 kg)  12/11/19 130 lb (59 kg)  07/30/19 131 lb (59.4 kg)   Constitutional: normal weight, in NAD Eyes: PERRLA, EOMI, no exophthalmos ENT: moist mucous membranes, no thyromegaly, no cervical lymphadenopathy Cardiovascular: RRR, No  MRG Respiratory: CTA B Gastrointestinal: abdomen soft, NT, ND, BS+ Musculoskeletal: no deformities, strength intact in all 4 Skin: moist, warm, no rashes Neurological: no tremor with outstretched hands, DTR normal in all 4  ASSESSMENT: 1. DM2, non-insulin-dependent, uncontrolled, without long-term complications, but with hyperglycemia  2. HL  PLAN:  1. Patient with longstanding, previously uncontrolled type 2 diabetes, on oral antidiabetic regimen (with DPP 4 inhibitor and SGLT2 inhibitor) with improved control after she started to adopt a more plant-based diet in 2020.  Her sugars improved significantly afterwards and at last visit HbA1c calculated from fructosamine was excellent, at 6.18%.  Her directly measured HbA1c levels are not very accurate for her.  At last visit, she had better sugars in the morning and before lunch but some of her bedtime sugars are higher, even in the 200s.  She felt that these were mostly due to pain in her left shoulder.  She had problems exercising due to this.  We did not change her regimen at that time. -At today's visit, sugars are slightly higher than before, with few spikes in the low 200s, however, she has been out of town and also had a URI with symptoms reminiscent of COVID-19. -Since in the last few days after she returned to her sugars are now at goal, we will continue the current regimen. - I suggested to:  Patient Instructions  Please continue: - Jardiance 10 mg daily before b'fast - Onglyza 5 mg daily before b'fast  Please come back for a follow-up appointment in 4 months.  - we checked her HbA1c: 7.4% (stable), but we will also check a fructosamine level - advised to check sugars at different times of the day - 1x a day, rotating check times - advised for yearly eye exams >> she is UTD - return to clinic in 4 months  2. HL -Reviewed latest lipid panel from 05/2019: All fractions at goal Lab Results  Component Value Date   CHOL 148  06/13/2019   HDL 62.00 06/13/2019   LDLCALC 71 06/13/2019   TRIG 78.0 06/13/2019   CHOLHDL 2 06/13/2019  -she was on low-dose Crestor 5 mg 4 times a week, however, she had to stop due to bitter taste in her mouth -Of note, she  had leg cramps from simvastatin in the past - she may benefit from 80 mg daily of Fluvastatin XL  -she is wondering why she needs to take this since her cholesterol levels are low, but I explained that lowering LDL is only one effect of statins, but they do much more to prevent cardiovascular disease, and having diabetes puts her at higher risk for CVD.   -She would like to wait until next lipid panel checked by PCP before starting this  Component     Latest Ref Rng & Units 05/14/2020  Hemoglobin A1C     4.0 - 5.6 % 7.4 (A)  Fructosamine     205 - 285 umol/L 285  HbA1c calculated from fructosamine is 6.45%. Philemon Kingdom, MD PhD [facility] Endocrinology

## 2020-05-14 ENCOUNTER — Encounter: Payer: Self-pay | Admitting: Internal Medicine

## 2020-05-14 ENCOUNTER — Ambulatory Visit (INDEPENDENT_AMBULATORY_CARE_PROVIDER_SITE_OTHER): Payer: Medicare Other | Admitting: Internal Medicine

## 2020-05-14 VITALS — BP 100/66 | HR 70 | Ht 62.0 in | Wt 127.5 lb

## 2020-05-14 DIAGNOSIS — E1165 Type 2 diabetes mellitus with hyperglycemia: Secondary | ICD-10-CM | POA: Diagnosis not present

## 2020-05-14 DIAGNOSIS — E785 Hyperlipidemia, unspecified: Secondary | ICD-10-CM

## 2020-05-14 DIAGNOSIS — E1169 Type 2 diabetes mellitus with other specified complication: Secondary | ICD-10-CM | POA: Diagnosis not present

## 2020-05-14 LAB — POCT GLYCOSYLATED HEMOGLOBIN (HGB A1C): Hemoglobin A1C: 7.4 % — AB (ref 4.0–5.6)

## 2020-05-14 NOTE — Patient Instructions (Addendum)
Please continue: - Jardiance 10 mg daily before b'fast - Onglyza 5 mg daily before b'fast  Please come back for a follow-up appointment in 4 months.

## 2020-05-20 LAB — FRUCTOSAMINE: Fructosamine: 285 umol/L (ref 205–285)

## 2020-07-15 ENCOUNTER — Encounter: Payer: Medicare Other | Admitting: Family Medicine

## 2020-07-15 ENCOUNTER — Telehealth: Payer: Self-pay | Admitting: Family Medicine

## 2020-07-15 NOTE — Telephone Encounter (Signed)
Left message for patient to call back and schedule Medicare Annual Wellness Visit (AWV) either virtually OR in office.   Last AWV 01/19/17; please schedule at anytime with [facility]Nurse Health Advisor at [facility].  This should be a 45 minute visit.

## 2020-07-18 ENCOUNTER — Other Ambulatory Visit: Payer: Self-pay | Admitting: Family Medicine

## 2020-07-18 DIAGNOSIS — Z1231 Encounter for screening mammogram for malignant neoplasm of breast: Secondary | ICD-10-CM

## 2020-07-22 DIAGNOSIS — K5900 Constipation, unspecified: Secondary | ICD-10-CM | POA: Diagnosis not present

## 2020-07-22 DIAGNOSIS — K645 Perianal venous thrombosis: Secondary | ICD-10-CM | POA: Diagnosis not present

## 2020-07-25 ENCOUNTER — Other Ambulatory Visit: Payer: Self-pay

## 2020-07-25 ENCOUNTER — Ambulatory Visit (INDEPENDENT_AMBULATORY_CARE_PROVIDER_SITE_OTHER): Payer: Medicare Other | Admitting: Family Medicine

## 2020-07-25 ENCOUNTER — Encounter: Payer: Self-pay | Admitting: Family Medicine

## 2020-07-25 VITALS — BP 99/63 | HR 59 | Temp 98.1°F | Ht 62.0 in | Wt 128.0 lb

## 2020-07-25 DIAGNOSIS — M8580 Other specified disorders of bone density and structure, unspecified site: Secondary | ICD-10-CM

## 2020-07-25 DIAGNOSIS — Z0001 Encounter for general adult medical examination with abnormal findings: Secondary | ICD-10-CM | POA: Diagnosis not present

## 2020-07-25 DIAGNOSIS — E559 Vitamin D deficiency, unspecified: Secondary | ICD-10-CM

## 2020-07-25 DIAGNOSIS — E1169 Type 2 diabetes mellitus with other specified complication: Secondary | ICD-10-CM

## 2020-07-25 DIAGNOSIS — E2839 Other primary ovarian failure: Secondary | ICD-10-CM | POA: Diagnosis not present

## 2020-07-25 DIAGNOSIS — E785 Hyperlipidemia, unspecified: Secondary | ICD-10-CM

## 2020-07-25 DIAGNOSIS — E119 Type 2 diabetes mellitus without complications: Secondary | ICD-10-CM

## 2020-07-25 DIAGNOSIS — E538 Deficiency of other specified B group vitamins: Secondary | ICD-10-CM | POA: Diagnosis not present

## 2020-07-25 LAB — LIPID PANEL
Cholesterol: 237 mg/dL — ABNORMAL HIGH (ref 0–200)
HDL: 65.9 mg/dL (ref >39.00)
LDL Cholesterol: 140 mg/dL — ABNORMAL HIGH (ref 0–99)
NonHDL: 171.4
Total CHOL/HDL Ratio: 4
Triglycerides: 158 mg/dL — ABNORMAL HIGH (ref 0.0–149.0)
VLDL: 31.6 mg/dL (ref 0.0–40.0)

## 2020-07-25 LAB — VITAMIN B12: Vitamin B-12: 470 pg/mL (ref 211–911)

## 2020-07-25 LAB — COMPREHENSIVE METABOLIC PANEL
ALT: 15 U/L (ref 0–35)
AST: 20 U/L (ref 0–37)
Albumin: 3.7 g/dL (ref 3.5–5.2)
Alkaline Phosphatase: 69 U/L (ref 39–117)
BUN: 17 mg/dL (ref 6–23)
CO2: 28 mEq/L (ref 19–32)
Calcium: 9.2 mg/dL (ref 8.4–10.5)
Chloride: 104 mEq/L (ref 96–112)
Creatinine, Ser: 0.83 mg/dL (ref 0.40–1.20)
GFR: 69.13 mL/min (ref >60.00)
Glucose, Bld: 94 mg/dL (ref 70–99)
Potassium: 4.1 mEq/L (ref 3.5–5.1)
Sodium: 138 mEq/L (ref 135–145)
Total Bilirubin: 0.4 mg/dL (ref 0.2–1.2)
Total Protein: 6.6 g/dL (ref 6.0–8.3)

## 2020-07-25 LAB — CBC
HCT: 43.6 % (ref 36.0–46.0)
Hemoglobin: 14.6 g/dL (ref 12.0–15.0)
MCHC: 33.6 g/dL (ref 30.0–36.0)
MCV: 90.9 fl (ref 78.0–100.0)
Platelets: 177 10*3/uL (ref 150.0–400.0)
RBC: 4.8 Mil/uL (ref 3.87–5.11)
RDW: 13.9 % (ref 11.5–15.5)
WBC: 6.2 10*3/uL (ref 4.0–10.5)

## 2020-07-25 LAB — TSH: TSH: 2.93 u[IU]/mL (ref 0.35–4.50)

## 2020-07-25 LAB — VITAMIN D 25 HYDROXY (VIT D DEFICIENCY, FRACTURES): VITD: 44.93 ng/mL (ref 30.00–100.00)

## 2020-07-25 MED ORDER — JARDIANCE 10 MG PO TABS: 10.0000 mg | ORAL_TABLET | Freq: Every day | ORAL | 3 refills | Status: AC

## 2020-07-25 MED ORDER — LOVASTATIN 20 MG PO TABS: 10.0000 mg | ORAL_TABLET | ORAL | 3 refills | Status: DC

## 2020-07-25 MED ORDER — ONGLYZA 5 MG PO TABS: 5.0000 mg | ORAL_TABLET | Freq: Every day | ORAL | 3 refills | Status: DC

## 2020-07-25 NOTE — Assessment & Plan Note (Signed)
Check Vitamin D.  

## 2020-07-25 NOTE — Assessment & Plan Note (Signed)
Managed by endocrinology.  Will refill meds today.

## 2020-07-25 NOTE — Patient Instructions (Signed)
It was very nice to see you today!  I will send in lovastatin.  Please take this instead of the Crestor.  We will check blood work today.  You are also due for your bone density scan-you should be able to get this done at the same time as your mammogram.  I will see you back in year.  Please come back to see me sooner if needed.  Take care, Dr [name]  PLEASE NOTE:  If you had any lab tests please let us know if you have not heard back within a few days. You may see your results on mychart before we have a chance to review them but we will give you a call once they are reviewed by [place]. If we ordered any referrals today, please let us know if you have not heard from their office within the next week.   Please try these tips to maintain a healthy lifestyle:   Eat at least 3 REAL meals and 1-2 snacks per day.  Aim for no more than 5 hours between eating.  If you eat breakfast, please do so within one hour of getting up.    Each meal should contain half fruits/vegetables, one quarter protein, and one quarter carbs (no bigger than a computer mouse)   Cut down on sweet beverages. This includes juice, soda, and sweet tea.     Drink at least 1 glass of water with each meal and aim for at least 8 glasses per day   Exercise at least 150 minutes every week.    Preventive Care 101 Years and Older, Female Preventive care refers to lifestyle choices and visits with your health care provider that can promote health and wellness. This includes:  A yearly physical exam. This is also called an annual wellness visit.  Regular dental and eye exams.  Immunizations.  Screening for certain conditions.  Healthy lifestyle choices, such as: ? Eating a healthy diet. ? Getting regular exercise. ? Not using drugs or products that contain nicotine and tobacco. ? Limiting alcohol use. What can I expect for my preventive care visit? Physical exam Your health care provider will check  your:  Height and weight. These may be used to calculate your BMI (body mass index). BMI is a measurement that tells if you are at a healthy weight.  Heart rate and blood pressure.  Body temperature.  Skin for abnormal spots. Counseling Your health care provider may ask you questions about your:  Past medical problems.  Family's medical history.  Alcohol, tobacco, and drug use.  Emotional well-being.  Home life and relationship well-being.  Sexual activity.  Diet, exercise, and sleep habits.  History of falls.  Memory and ability to understand (cognition).  Work and work Statistician.  Pregnancy and menstrual history.  Access to firearms. What immunizations do I need? Vaccines are usually given at various ages, according to a schedule. Your health care provider will recommend vaccines for you based on your age, medical history, and lifestyle or other factors, such as travel or where you work.   What tests do I need? Blood tests  Lipid and cholesterol levels. These may be checked every 5 years, or more often depending on your overall health.  Hepatitis C test.  Hepatitis B test. Screening  Lung cancer screening. You may have this screening every year starting at age 11 if you have a 30-pack-year history of smoking and currently smoke or have quit within the past 15 years.  Colorectal cancer  screening. ? All adults should have this screening starting at age 50 and continuing until age 38. ? Your health care provider may recommend screening at age 41 if you are at increased risk. ? You will have tests every 1-10 years, depending on your results and the type of screening test.  Diabetes screening. ? This is done by checking your blood sugar (glucose) after you have not eaten for a while (fasting). ? You may have this done every 1-3 years.  Mammogram. ? This may be done every 1-2 years. ? Talk with your health care provider about how often you should have regular  mammograms.  Abdominal aortic aneurysm (AAA) screening. You may need this if you are a current or former smoker.  BRCA-related cancer screening. This may be done if you have a family history of breast, ovarian, tubal, or peritoneal cancers. Other tests  STD (sexually transmitted disease) testing, if you are at risk.  Bone density scan. This is done to screen for osteoporosis. You may have this done starting at age 35. Talk with your health care provider about your test results, treatment options, and if necessary, the need for more tests. Follow these instructions at home: Eating and drinking  Eat a diet that includes fresh fruits and vegetables, whole grains, lean protein, and low-fat dairy products. Limit your intake of foods with high amounts of sugar, saturated fats, and salt.  Take vitamin and mineral supplements as recommended by your health care provider.  Do not drink alcohol if your health care provider tells you not to drink.  If you drink alcohol: ? Limit how much you have to 0-1 drink a day. ? Be aware of how much alcohol is in your drink. In the [place] one drink equals one 12 oz bottle of beer (355 mL), one 5 oz glass of wine (148 mL), or one 1 oz glass of hard liquor (44 mL).   Lifestyle  Take daily care of your teeth and gums. Brush your teeth every morning and night with fluoride toothpaste. Floss one time each day.  Stay active. Exercise for at least 30 minutes 5 or more days each week.  Do not use any products that contain nicotine or tobacco, such as cigarettes, e-cigarettes, and chewing tobacco. If you need help quitting, ask your health care provider.  Do not use drugs.  If you are sexually active, practice safe sex. Use a condom or other form of protection in order to prevent STIs (sexually transmitted infections).  Talk with your health care provider about taking a low-dose aspirin or statin.  Find healthy ways to cope with stress, such as: ? Meditation,  yoga, or listening to music. ? Journaling. ? Talking to a trusted person. ? Spending time with friends and family. Safety  Always wear your seat belt while driving or riding in a vehicle.  Do not drive: ? If you have been drinking alcohol. Do not ride with someone who has been drinking. ? When you are tired or distracted. ? While texting.  Wear a helmet and other protective equipment during sports activities.  If you have firearms in your house, make sure you follow all gun safety procedures. What's next?  Visit your health care provider once a year for an annual wellness visit.  Ask your health care provider how often you should have your eyes and teeth checked.  Stay up to date on all vaccines. This information is not intended to replace advice given to you by your health  care provider. Make sure you discuss any questions you have with your health care provider. Document Revised: 04/02/2020 Document Reviewed: 04/06/2018 Elsevier Patient Education  2021 [employer].

## 2020-07-25 NOTE — Assessment & Plan Note (Signed)
Will order DEXA. Check Vitamin D.

## 2020-07-25 NOTE — Assessment & Plan Note (Signed)
Check B12 

## 2020-07-25 NOTE — Progress Notes (Signed)
Chief Complaint:  Cleotilde Spadaccini is a 75 y.o. female who presents today for her annual comprehensive physical exam.    Assessment/Plan:  Chronic Problems Addressed Today: Low vitamin B12 level Check B12.   Vitamin D deficiency Check Vitamin D.   Osteopenia Will order DEXA. Check Vitamin D.   Dyslipidemia associated with type 2 diabetes mellitus (HCC) Check lipids.  She has not tolerated Crestor or simvastatin.  We will try low-dose lovastatin 10 mg 3 times weekly.  If still cannot tolerate will likely not try any other statins.  DM II (diabetes mellitus, type II), controlled ([place]) Managed by endocrinology.  Will refill meds today.  Preventative Healthcare: Check labs.  Due for mammogram and DEXA scan.  Will order DEXA today.  Up-to-date on vaccines.  Patient Counseling(The following topics were reviewed and/or handout was given):  -Nutrition: Stressed importance of moderation in sodium/caffeine intake, saturated fat and cholesterol, caloric balance, sufficient intake of fresh fruits, vegetables, and fiber.  -Stressed the importance of regular exercise.   -Substance Abuse: Discussed cessation/primary prevention of tobacco, alcohol, or other drug use; driving or other dangerous activities under the influence; availability of treatment for abuse.   -Injury prevention: Discussed safety belts, safety helmets, smoke detector, smoking near bedding or upholstery.   -Sexuality: Discussed sexually transmitted diseases, partner selection, use of condoms, avoidance of unintended pregnancy and contraceptive alternatives.   -Dental health: Discussed importance of regular tooth brushing, flossing, and dental visits.  -Health maintenance and immunizations reviewed. Please refer to Health maintenance section.  Return to care in 1 year for next preventative visit.     Subjective:  HPI:  She has no acute complaints today.   Lifestyle Diet: Balanced. Exercise: Goes to gym  regularly.  Depression screen PHQ 2/9 07/25/2020  Decreased Interest 0  Down, Depressed, Hopeless 0  PHQ - 2 Score 0  Altered sleeping -  Tired, decreased energy -  Change in appetite -  Feeling bad or failure about yourself  -  Trouble concentrating -  Moving slowly or fidgety/restless -  Suicidal thoughts -  PHQ-9 Score -    Health Maintenance Due  Topic Date Due  . FOOT EXAM  06/30/2017  . URINE MICROALBUMIN  06/30/2017  . OPHTHALMOLOGY EXAM  08/29/2019  . COVID-19 Vaccine (3 - Booster for Pfizer series) 12/09/2019     ROS: Per HPI, otherwise a complete review of systems was negative.   PMH:  The following were reviewed and entered/updated in epic: Past Medical History:  Diagnosis Date  . Diabetes mellitus without complication ([place]) 42/59/5638  . History of ear infections   . Hyperlipidemia    Patient Active Problem List   Diagnosis Date Noted  . Vitamin D deficiency 12/11/2018  . Low vitamin B12 level 12/11/2018  . Osteopenia 08/10/2016  . DM II (diabetes mellitus, type II), controlled ([place]) 06/30/2016  . Dyslipidemia associated with type 2 diabetes mellitus ([place]) 06/30/2016   Past Surgical History:  Procedure Laterality Date  . TONSILLECTOMY      Family History  Problem Relation Age of Onset  . Hypertension Father   . Diabetes Mother   . Depression Mother   . Mental illness Mother   . Kidney disease Mother   . Alzheimer's disease Mother   . Glaucoma Mother   . Breast cancer Neg Hx     Medications- reviewed and updated Current Outpatient Medications  Medication Sig Dispense Refill  . ACCU-CHEK SOFTCLIX LANCETS lancets USE TO TEST BLOOD SUGAR TWICE DAILY.  100 each 12  . Blood Glucose Monitoring Suppl (ACCU-CHEK AVIVA PLUS) w/Device KIT USE TO TEST BLOOD SUGAR TWICE DAILY 1 kit 0  . Cholecalciferol (VITAMIN D3) 50 MCG (2000 UT) TABS Take by mouth.     Marland Kitchen glucose blood (ACCU-CHEK AVIVA PLUS) test strip USE TO TEST BLOOD SUGAR TWICE DAILY 200 each 12  .  lovastatin (MEVACOR) 20 MG tablet Take 0.5 tablets (10 mg total) by mouth 3 (three) times a week. 45 tablet 3  . Multiple Vitamin (MULTIVITAMIN) tablet Take 1 tablet by mouth daily.    Marland Kitchen JARDIANCE 10 MG TABS tablet Take 1 tablet (10 mg total) by mouth daily. 90 tablet 3  . NEOMYCIN-POLYMYXIN-HYDROCORTISONE (CORTISPORIN) 1 % SOLN OTIC solution 2 drops affected ear at bedtime (Patient not taking: Reported on 07/25/2020) 10 mL 2  . ONGLYZA 5 MG TABS tablet Take 1 tablet (5 mg total) by mouth daily. 90 tablet 3   No current facility-administered medications for this visit.    Allergies-reviewed and updated Allergies  Allergen Reactions  . Ivp Dye [Iodinated Diagnostic Agents] Anaphylaxis  . Sulfa Antibiotics Anaphylaxis  . Percocet [Oxycodone-Acetaminophen] Nausea And Vomiting  . Glucophage [Metformin Hcl] Rash    Social History   Socioeconomic History  . Marital status: Married    Spouse name: Not on file  . Number of children: Not on file  . Years of education: Not on file  . Highest education level: Not on file  Occupational History  . Not on file  Tobacco Use  . Smoking status: Never Smoker  . Smokeless tobacco: Never Used  Substance and Sexual Activity  . Alcohol use: No  . Drug use: No  . Sexual activity: Not Currently  Other Topics Concern  . Not on file  Social History Narrative  . Not on file   Social Determinants of Health   Financial Resource Strain: Not on file  Food Insecurity: Not on file  Transportation Needs: Not on file  Physical Activity: Not on file  Stress: Not on file  Social Connections: Not on file        Objective:  Physical Exam: BP 99/63   Pulse (!) 59   Temp 98.1 F (36.7 C) (Temporal)   Ht _0  (1.575 m)   Wt 128 lb (58.1 kg)   SpO2 96%   BMI 23.41 kg/m   Body mass index is 23.41 kg/m. Wt Readings from Last 3 Encounters:  07/25/20 128 lb (58.1 kg)  05/14/20 127 lb 8 oz (57.8 kg)  12/11/19 130 lb (59 kg)   Gen: NAD, resting  comfortably HEENT: TMs normal bilaterally. OP clear. No thyromegaly noted.  CV: RRR with no murmurs appreciated Pulm: NWOB, CTAB with no crackles, wheezes, or rhonchi GI: Normal bowel sounds present. Soft, Nontender, Nondistended. MSK: no edema, cyanosis, or clubbing noted Skin: warm, dry Neuro: CN2-12 grossly intact. Strength 5/5 in upper and lower extremities. Reflexes symmetric and intact bilaterally.  Psych: Normal affect and thought content     Ariyan Brisendine M. [name], MD 07/25/2020 2:48 PM

## 2020-07-25 NOTE — Assessment & Plan Note (Signed)
Check lipids.  She has not tolerated Crestor or simvastatin.  We will try low-dose lovastatin 10 mg 3 times weekly.  If still cannot tolerate will likely not try any other statins.

## 2020-07-28 NOTE — Progress Notes (Signed)
Please inform patient of the following:  Her cholesterol is elevated but everything else is stable. Would like for her to let us know how she is tolerating the lovastatin but do not need to do any further testing at this time. We can recheck in a year.  Katherine Robinson. [name], MD 07/28/2020 8:26 AM

## 2020-08-01 ENCOUNTER — Encounter: Payer: Self-pay | Admitting: Family Medicine

## 2020-08-04 NOTE — Telephone Encounter (Signed)
Please advise 

## 2020-08-05 ENCOUNTER — Other Ambulatory Visit: Payer: Self-pay | Admitting: *Deleted

## 2020-08-05 MED ORDER — ROSUVASTATIN CALCIUM 5 MG PO TABS: 5.0000 mg | ORAL_TABLET | ORAL | 0 refills | Status: DC

## 2020-08-07 ENCOUNTER — Other Ambulatory Visit: Payer: Self-pay

## 2020-08-07 ENCOUNTER — Ambulatory Visit
Admission: RE | Admit: 2020-08-07 | Discharge: 2020-08-07 | Disposition: A | Discharge status: Home / Self Care | Payer: Medicare Other | Source: Ambulatory Visit | Attending: Family Medicine | Admitting: Family Medicine

## 2020-08-07 DIAGNOSIS — M8589 Other specified disorders of bone density and structure, multiple sites: Secondary | ICD-10-CM | POA: Diagnosis not present

## 2020-08-07 DIAGNOSIS — M8580 Other specified disorders of bone density and structure, unspecified site: Secondary | ICD-10-CM

## 2020-08-07 DIAGNOSIS — Z780 Asymptomatic menopausal state: Secondary | ICD-10-CM | POA: Diagnosis not present

## 2020-08-07 DIAGNOSIS — E2839 Other primary ovarian failure: Secondary | ICD-10-CM

## 2020-08-07 NOTE — Progress Notes (Signed)
Please inform patient of the following:  Bone density scan shows stable osteopenia. Do not need to make any changes to her treatment plan at this time. WE can recheck in 2 years.   Algis Greenhouse. [name], MD 08/07/2020 3:36 PM

## 2020-08-09 ENCOUNTER — Encounter: Payer: Self-pay | Admitting: Family Medicine

## 2020-08-11 MED ORDER — ROSUVASTATIN CALCIUM 5 MG PO TABS: 5.0000 mg | ORAL_TABLET | ORAL | 0 refills | Status: DC

## 2020-08-13 NOTE — Telephone Encounter (Signed)
rosuvastatin (CRESTOR) 5 MG tablet  Pt states that [facility] did not receive the prescription and she wants it resent as soon as possible.

## 2020-08-15 ENCOUNTER — Other Ambulatory Visit: Payer: Self-pay

## 2020-08-15 MED ORDER — ROSUVASTATIN CALCIUM 5 MG PO TABS: 5.0000 mg | ORAL_TABLET | ORAL | 0 refills | Status: DC

## 2020-08-15 NOTE — Telephone Encounter (Signed)
Rx resent.

## 2020-08-19 ENCOUNTER — Ambulatory Visit: Payer: Medicare Other | Attending: Internal Medicine

## 2020-08-19 ENCOUNTER — Encounter: Payer: Self-pay | Admitting: Family Medicine

## 2020-08-19 ENCOUNTER — Other Ambulatory Visit: Payer: Self-pay | Admitting: *Deleted

## 2020-08-19 DIAGNOSIS — Z20822 Contact with and (suspected) exposure to covid-19: Secondary | ICD-10-CM

## 2020-08-19 MED ORDER — ROSUVASTATIN CALCIUM 5 MG PO TABS: 5.0000 mg | ORAL_TABLET | ORAL | 0 refills | Status: DC

## 2020-08-20 LAB — SARS-COV-2, NAA 2 DAY TAT

## 2020-08-20 LAB — NOVEL CORONAVIRUS, NAA: SARS-CoV-2, NAA: NOT DETECTED

## 2020-08-20 NOTE — Telephone Encounter (Signed)
Call pharmacy at [place] stated Rx Crestor was order and will be send to patient  Patient was notified

## 2020-09-12 ENCOUNTER — Ambulatory Visit
Admission: RE | Admit: 2020-09-12 | Discharge: 2020-09-12 | Disposition: A | Discharge status: Home / Self Care | Payer: Medicare Other | Source: Ambulatory Visit | Attending: Family Medicine | Admitting: Family Medicine

## 2020-09-12 ENCOUNTER — Ambulatory Visit: Payer: Medicare Other | Admitting: Internal Medicine

## 2020-09-12 ENCOUNTER — Other Ambulatory Visit: Payer: Self-pay

## 2020-09-12 DIAGNOSIS — Z1231 Encounter for screening mammogram for malignant neoplasm of breast: Secondary | ICD-10-CM | POA: Diagnosis not present

## 2020-09-16 ENCOUNTER — Encounter: Payer: Self-pay | Admitting: Family Medicine

## 2020-09-16 DIAGNOSIS — Z23 Encounter for immunization: Secondary | ICD-10-CM | POA: Diagnosis not present

## 2020-09-16 NOTE — Telephone Encounter (Signed)
Please see note.

## 2020-10-07 NOTE — Telephone Encounter (Signed)
Coding has been updated.    DOS has been resubmitted.  I have notified patient.

## 2020-10-10 DIAGNOSIS — Z881 Allergy status to other antibiotic agents status: Secondary | ICD-10-CM | POA: Diagnosis not present

## 2020-10-10 DIAGNOSIS — Z79899 Other long term (current) drug therapy: Secondary | ICD-10-CM | POA: Diagnosis not present

## 2020-10-10 DIAGNOSIS — Z885 Allergy status to narcotic agent status: Secondary | ICD-10-CM | POA: Diagnosis not present

## 2020-10-10 DIAGNOSIS — K0889 Other specified disorders of teeth and supporting structures: Secondary | ICD-10-CM | POA: Diagnosis not present

## 2020-10-10 DIAGNOSIS — Z888 Allergy status to other drugs, medicaments and biological substances status: Secondary | ICD-10-CM | POA: Diagnosis not present

## 2020-10-10 DIAGNOSIS — Z882 Allergy status to sulfonamides status: Secondary | ICD-10-CM | POA: Diagnosis not present

## 2020-10-10 DIAGNOSIS — E7800 Pure hypercholesterolemia, unspecified: Secondary | ICD-10-CM | POA: Diagnosis not present

## 2020-10-10 DIAGNOSIS — K089 Disorder of teeth and supporting structures, unspecified: Secondary | ICD-10-CM | POA: Diagnosis not present

## 2020-10-10 DIAGNOSIS — E119 Type 2 diabetes mellitus without complications: Secondary | ICD-10-CM | POA: Diagnosis not present

## 2020-10-20 ENCOUNTER — Ambulatory Visit (INDEPENDENT_AMBULATORY_CARE_PROVIDER_SITE_OTHER): Payer: Medicare Other | Admitting: Internal Medicine

## 2020-10-20 ENCOUNTER — Encounter: Payer: Self-pay | Admitting: Internal Medicine

## 2020-10-20 ENCOUNTER — Other Ambulatory Visit: Payer: Self-pay

## 2020-10-20 VITALS — BP 108/60 | HR 60 | Ht 62.0 in | Wt 124.6 lb

## 2020-10-20 DIAGNOSIS — E1169 Type 2 diabetes mellitus with other specified complication: Secondary | ICD-10-CM

## 2020-10-20 DIAGNOSIS — E1165 Type 2 diabetes mellitus with hyperglycemia: Secondary | ICD-10-CM

## 2020-10-20 DIAGNOSIS — E785 Hyperlipidemia, unspecified: Secondary | ICD-10-CM | POA: Diagnosis not present

## 2020-10-20 LAB — POCT GLYCOSYLATED HEMOGLOBIN (HGB A1C): Hemoglobin A1C: 7.6 % — AB (ref 4.0–5.6)

## 2020-10-20 MED ORDER — CRESTOR 5 MG PO TABS: 5.0000 mg | ORAL_TABLET | ORAL | 3 refills | Status: AC

## 2020-10-20 NOTE — Progress Notes (Signed)
Patient ID: Katherine Robinson, female   DOB: 1945/06/08, 75 y.o.   MRN: [id number]   This visit occurred during the SARS-CoV-2 public health emergency.  Safety protocols were in place, including screening questions prior to the visit, additional usage of staff PPE, and extensive cleaning of exam room while observing appropriate contact time as indicated for disinfecting solutions.   HPI: Katherine Robinson is a 75 y.o.-year-old female, initially referred by her PCP, Dr.Aasmaa, returning for follow-up for DM2, dx in 2000, non-insulin-dependent, fairly well controlled, without long-term complications.  Last visit 5 months ago. She moved to the area from [place].  Interim history: No increased urination, blurry vision, nausea, chest pain. She had problems with a dental crown that fell off while she was in [place] >> now repaired.  She will go to [place] in October to have more extensive dental work.  Reviewed HbA1c levels: 05/14/2020: HbA1c calculated from fructosamine is 6.45%. Lab Results  Component Value Date   HGBA1C 7.4 (A) 05/14/2020   HGBA1C 7.4 (A) 12/11/2019   HGBA1C 7.4 (A) 07/30/2019   HGBA1C 7.0 (A) 03/19/2019   HGBA1C 6.9 (A) 11/14/2018   HGBA1C 7.1 (H) 06/30/2016   HGBA1C 6.9 01/13/2015   HGBA1C 6.8 09/02/2014  12/11/2019: HbA1c calculated from fructosamine is 6.18%, better than the one directly measured. 07/30/2019: HbA1c calculated from fructosamine is actually much better, at 6.06%. 07/04/2018: HbA1c 7.0% 02/20/2018: HbA1c 7.1% 09/2016: C-peptide 1.46  She is on: - Onglyza 5 mg at night >> before breakfast - Jardiance 10 mg before breakfast She was on metformin until 2014 when she developed intolerance: "Bitter mouth".  She tried metformin ER but this caused generalized itching, rash, with generalized pain all over her body. She tried Invokana.   Pt checks her sugars once a day per review of her meter download: - am: 102-132 >> 115-139, 144 >> 127-131 >> 98-132 >>  95-141 - 2h after b'fast: n/c >> 127 - before lunch: 80-106 >> 80-111 >> 74-94 >> 115, 173, 214 >> 86-101, 150 - 2h after lunch: n/c - before dinner: 145 >> 154, 171 >> n/c >> 135, 167 >> 103 >> 137 - 2h after dinner: n/c - bedtime:  159, 160, 205, 220 (pain), 236 (pizza) >> 189, 202 >> 134-188 - nighttime: n/c Lowest sugar was 78 >> 98 >> 86; she has hypoglycemia awareness in the 70s.  Highest sugar was 200 (pizza) >> 202 >> 188.  Glucometer: Accu-Chek Aviva plus  Pt's meals are: - Breakfast: Oatmeal with pineapple, half a banana, raspberries, blueberries, yogurt, cottage cheese, 1 cup of tea, 1 wheat toast, feta cheese-small piece every morning - Lunch: Soup (1/2 cup), salad, half a cup of rice with veggies, chicken/fish and one fourth of a bowl from morning oatmeal with yogurt and 2 cookies - Dinner: Popcorn (100 cal) or crackers and 2 bottles of water - Snacks: no She saw dietitian in [place] when diagnosed in 2000.  She also saw our dietitian in 08/2018.  She was exercising consistently at [employer] by doing cardio 6 times a week, but stopped during the coronavirus pandemic.  She started exercising at home on the treadmill, but before last visit she returned to the gym.  However, she was experiencing left shoulder pain. As of now, she again exercises consistently, qod.  -No CKD, last BUN/creatinine:  Lab Results  Component Value Date   BUN 17 07/25/2020   BUN 14 06/13/2019   CREATININE 0.83 07/25/2020   CREATININE 0.74  06/13/2019  07/04/2018: 13/0.88, GFR 58, glucose 123 02/20/2018: ACR 4  -+ HL; last set of lipids: Lab Results  Component Value Date   CHOL 237 (H) 07/25/2020   HDL 65.90 07/25/2020   LDLCALC 140 (H) 07/25/2020   TRIG 158.0 (H) 07/25/2020   CHOLHDL 4 07/25/2020  02/20/2018: 150/114/64/66 She was on Crestor 5 mg 4 times a week >> stopped 2/2 bitter taste in month. She tried Simvastatin in the past >> leg cramps. She restarted on Crestor 2x a week.   She tolerates this well.  - last eye exam was in 01/2020: No DR reportedly  - no numbness and tingling in her feet.  Pt has FH of DM in mother, daughter.  She also has a history of osteopenia - diagnosed 04/2015.  She has vitamin D deficiency, psoriasis, history of PE 02/2018, colon polyp. She stopped B12 in 05/2019 as her B12 was too high and she developed dizziness at that time, resolved after stopping B12.  She was an ICU nurse, retired in 2016.  ROS: Constitutional: no weight gain/+ weight loss, no fatigue, no subjective hyperthermia, no subjective hypothermia Eyes: no blurry vision, no xerophthalmia ENT: no sore throat, no nodules palpated in neck, no dysphagia, no odynophagia, no hoarseness Cardiovascular: no CP/no SOB/no palpitations/no leg swelling Respiratory: no cough/no SOB/no wheezing Gastrointestinal: no N/no V/no D/no C/no acid reflux Musculoskeletal: no muscle aches/+ joint aches Skin: no rashes, no hair loss Neurological: no tremors/no numbness/no tingling/no dizziness  I reviewed pt's medications, allergies, PMH, social hx, family hx, and changes were documented in the history of present illness. Otherwise, unchanged from my initial visit note.  Past Medical History:  Diagnosis Date   Diabetes mellitus without complication ([place]) 27/51/7001   History of ear infections    Hyperlipidemia    Past Surgical History:  Procedure Laterality Date   TONSILLECTOMY     Social History   Socioeconomic History   Marital status: Married    Spouse name: Not on file   Number of children: 4 Kalman Shan, Elta Guadeloupe, Vicie Mutters) (she also had an ectopic pregnancy)    Years of education: Not on file   Highest education level: Not on file  Occupational History    Retired ICU nurse  Social Needs   Financial resource strain: Not on file   Food insecurity:    Worry: Not on file    Inability: Not on file   Transportation needs:    Medical: Not on file    Non-medical: Not on file   Tobacco Use   Smoking status: Never Smoker   Smokeless tobacco: Never Used  Substance and Sexual Activity   Alcohol use:  Yes, wine, 2 drinks weekly   Drug use: No   Current Outpatient Medications on File Prior to Visit  Medication Sig Dispense Refill   ACCU-CHEK SOFTCLIX LANCETS lancets USE TO TEST BLOOD SUGAR TWICE DAILY. 100 each 12   Blood Glucose Monitoring Suppl (ACCU-CHEK AVIVA PLUS) w/Device KIT USE TO TEST BLOOD SUGAR TWICE DAILY 1 kit 0   Cholecalciferol (VITAMIN D3) 50 MCG (2000 UT) TABS Take by mouth.      glucose blood (ACCU-CHEK AVIVA PLUS) test strip USE TO TEST BLOOD SUGAR TWICE DAILY 200 each 12   JARDIANCE 10 MG TABS tablet Take 1 tablet (10 mg total) by mouth daily. 90 tablet 3   Multiple Vitamin (MULTIVITAMIN) tablet Take 1 tablet by mouth daily.     NEOMYCIN-POLYMYXIN-HYDROCORTISONE (CORTISPORIN) 1 % SOLN OTIC solution 2 drops affected ear  at bedtime (Patient not taking: Reported on 07/25/2020) 10 mL 2   ONGLYZA 5 MG TABS tablet Take 1 tablet (5 mg total) by mouth daily. 90 tablet 3   rosuvastatin (CRESTOR) 5 MG tablet Take 1 tablet (5 mg total) by mouth 2 (two) times a week. 24 tablet 0   No current facility-administered medications on file prior to visit.   Allergies  Allergen Reactions   Ivp Dye [Iodinated Diagnostic Agents] Anaphylaxis   Sulfa Antibiotics Anaphylaxis   Percocet [Oxycodone-Acetaminophen] Nausea And Vomiting   Glucophage [Metformin Hcl] Rash   Family History  Problem Relation Age of Onset   Hypertension Father    Diabetes Mother    Depression Mother    Mental illness Mother    Kidney disease Mother    Alzheimer's disease Mother    Glaucoma Mother    Breast cancer Neg Hx   Also, HTN in mother, HL in mother.  PE: BP 108/60   Pulse 60   Ht 5' 2"  (1.575 m)   Wt 124 lb 9.6 oz (56.5 kg)   SpO2 95%   BMI 22.79 kg/m  Wt Readings from Last 3 Encounters:  10/20/20 124 lb 9.6 oz (56.5 kg)  07/25/20 128 lb (58.1 kg)  05/14/20 127 lb 8 oz  (57.8 kg)   Constitutional: normal weight, in NAD Eyes: PERRLA, EOMI, no exophthalmos ENT: moist mucous membranes, no thyromegaly, no cervical lymphadenopathy Cardiovascular: RRR, No MRG Respiratory: CTA B Gastrointestinal: abdomen soft, NT, ND, BS+ Musculoskeletal: no deformities, strength intact in all 4 Skin: moist, warm, no rashes Neurological: no tremor with outstretched hands, DTR normal in all 4  ASSESSMENT: 1. DM2, non-insulin-dependent, uncontrolled, without long-term complications, but with hyperglycemia  2. HL  PLAN:  1. Patient with longstanding, previously uncontrolled type 2 diabetes, on oral antidiabetic regimen with SGLT2 inhibitor and DPP 4 inhibitor, which improved blood sugars especially after adopting a more plant-based diet in 2020.  HbA1c at last visit was 6.45% as calculated from fructosamine, which is more accurate for him than the directly measured HbA1c, which was 7.4%.  Sugars were slightly higher than before with few spikes in the low 200s, while she was out of town and also had a URI.  We did not change the regimen at that time. -At today's visit, sugars are mostly at goal with few mildly hyperglycemic exceptions.  She feels that these higher blood sugars were due to her dental work recently and the increased pain that she had from a misplaced crown.  Since the majority of her blood sugars are still at goal, I would not suggest a change in regimen for now. - I suggested to:  Patient Instructions  Please continue: - Jardiance 10 mg daily before b'fast - Onglyza 5 mg daily before b'fast  Please come back for a follow-up appointment in 4 months.  - we checked her HbA1c: 7.6% (higher), but we will also check a fructosamine level - advised to check sugars at different times of the day - 1x a day, rotating check times - advised for yearly eye exams >> she is UTD - return to clinic in 4 months  2. HL -Reviewed latest lipid panel from 07/2020: LDL above target,  triglycerides slightly high: Lab Results  Component Value Date   CHOL 237 (H) 07/25/2020   HDL 65.90 07/25/2020   LDLCALC 140 (H) 07/25/2020   TRIG 158.0 (H) 07/25/2020   CHOLHDL 4 07/25/2020  -She was on the low-dose Crestor, 5 mg 4 times a  week, however, she had to stop due to bitter taste in her mouth.  She also could not tolerate simvastatin due to leg cramps. -We discussed about fluvastatin XL at last visit and I recommended this or to try another statin to lower her risk for CVD but she wanted to wait until the next lipid panel checked by PCP before starting this. -After the above results returned, she was restarted on 5 mg of Crestor twice a week by PCP.  She tolerates this well.  Philemon Kingdom, MD PhD [facility] Endocrinology

## 2020-10-20 NOTE — Addendum Note (Signed)
Addended by: Tyrone Apple on: 10/20/2020 03:10 PM   Modules accepted: Orders

## 2020-10-20 NOTE — Patient Instructions (Signed)
Please continue: - Jardiance 10 mg daily before b'fast - Onglyza 5 mg daily before b'fast  Please come back for a follow-up appointment in 4 months.

## 2020-12-22 ENCOUNTER — Telehealth: Payer: Self-pay | Admitting: Internal Medicine

## 2020-12-22 DIAGNOSIS — E1165 Type 2 diabetes mellitus with hyperglycemia: Secondary | ICD-10-CM

## 2020-12-22 NOTE — Telephone Encounter (Signed)
MEDICATION: Accu-Chek Aviva Plus Test Strips  PHARMACY:  [facility]  HAS THE PATIENT CONTACTED THEIR PHARMACY?  yes  IS THIS A 90 DAY SUPPLY :  yes  IS PATIENT OUT OF MEDICATION: yes  IF NOT; HOW MUCH IS LEFT:   LAST APPOINTMENT DATE: '@6'$ /27/2022  NEXT APPOINTMENT DATE:'@11'$ /11/2020  DO WE HAVE YOUR PERMISSION TO LEAVE A DETAILED MESSAGE?: yes

## 2020-12-23 ENCOUNTER — Telehealth: Payer: Self-pay | Admitting: Internal Medicine

## 2020-12-23 MED ORDER — ACCU-CHEK SOFTCLIX LANCETS MISC: 12 refills | Status: AC

## 2020-12-23 MED ORDER — ACCU-CHEK AVIVA PLUS VI STRP: ORAL_STRIP | 12 refills | Status: AC

## 2020-12-23 NOTE — Telephone Encounter (Signed)
MEDICATION: glucose blood (ACCU-CHEK AVIVA PLUS) test strip  PHARMACY:   [facility] [place] [place] [facility] Phone:  [phone]  Fax:  [place] CONTACTED [place]?  Needs new RX  IS THIS A 90 DAY SUPPLY : Yes  IS PATIENT OUT OF MEDICATION: No  IF NOT; HOW MUCH IS LEFT: Approx. 10 days  LAST APPOINTMENT DATE: '@6'$ /72022  NEXT APPOINTMENT DATE:'@11'$ /11/2020  DO WE HAVE YOUR PERMISSION TO LEAVE A DETAILED MESSAGE?: Yes  OTHER COMMENTS:    **Let patient know to contact pharmacy at the end of the day to make sure medication is ready. **  ** Please notify patient to allow 48-72 hours to process**  **Encourage patient to contact the pharmacy for refills or they can request refills through [facility]**

## 2020-12-23 NOTE — Telephone Encounter (Signed)
Rx sent to preferred pharmacy.

## 2021-01-07 DIAGNOSIS — H25813 Combined forms of age-related cataract, bilateral: Secondary | ICD-10-CM | POA: Diagnosis not present

## 2021-01-07 DIAGNOSIS — H5201 Hypermetropia, right eye: Secondary | ICD-10-CM | POA: Diagnosis not present

## 2021-01-07 DIAGNOSIS — H524 Presbyopia: Secondary | ICD-10-CM | POA: Diagnosis not present

## 2021-01-07 DIAGNOSIS — E119 Type 2 diabetes mellitus without complications: Secondary | ICD-10-CM | POA: Diagnosis not present

## 2021-01-30 DIAGNOSIS — Z23 Encounter for immunization: Secondary | ICD-10-CM | POA: Diagnosis not present

## 2021-01-31 ENCOUNTER — Encounter: Payer: Self-pay | Admitting: Family Medicine

## 2021-03-03 ENCOUNTER — Encounter: Payer: Self-pay | Admitting: Internal Medicine

## 2021-03-03 ENCOUNTER — Ambulatory Visit (INDEPENDENT_AMBULATORY_CARE_PROVIDER_SITE_OTHER): Payer: Medicare Other | Admitting: Internal Medicine

## 2021-03-03 ENCOUNTER — Other Ambulatory Visit: Payer: Self-pay

## 2021-03-03 VITALS — BP 110/62 | HR 88 | Ht 62.0 in | Wt 125.8 lb

## 2021-03-03 DIAGNOSIS — E1169 Type 2 diabetes mellitus with other specified complication: Secondary | ICD-10-CM | POA: Diagnosis not present

## 2021-03-03 DIAGNOSIS — E785 Hyperlipidemia, unspecified: Secondary | ICD-10-CM | POA: Diagnosis not present

## 2021-03-03 DIAGNOSIS — E1165 Type 2 diabetes mellitus with hyperglycemia: Secondary | ICD-10-CM | POA: Diagnosis not present

## 2021-03-03 NOTE — Patient Instructions (Signed)
Please continue: - Jardiance 10 mg daily before b'fast - Onglyza 5 mg daily before b'fast  Please come back for a follow-up appointment in 4 months.

## 2021-03-03 NOTE — Progress Notes (Addendum)
Patient ID: Katherine Robinson, female   DOB: [birth year], 75 y.o.   MRN: [id number]   This visit occurred during the SARS-CoV-2 public health emergency.  Safety protocols were in place, including screening questions prior to the visit, additional usage of staff PPE, and extensive cleaning of exam room while observing appropriate contact time as indicated for disinfecting solutions.   HPI: Katherine Robinson is a 75 y.o.-year-old female, initially referred by her PCP, Dr.Aasmaa, returning for follow-up for DM2, dx in 2000, non-insulin-dependent, fairly well controlled, without long-term complications.  Last visit 4.5 months ago. She moved to the area from [place].  Interim history: No increased urination, blurry vision, nausea, chest pain. However, she tells me she does not feel well after eating out 3 nights ago >> diarrhea.  She tried Pepto-Bismol for it and feels that it is may be slowing down, but she feels very tired because of it. She recently returned from [place] where she underwent extensive dental work.  Reviewed HbA1c levels: Lab Results  Component Value Date   HGBA1C 7.6 (A) 10/20/2020   HGBA1C 7.4 (A) 05/14/2020   HGBA1C 7.4 (A) 12/11/2019   HGBA1C 7.4 (A) 07/30/2019   HGBA1C 7.0 (A) 03/19/2019   HGBA1C 6.9 (A) 11/14/2018   HGBA1C 7.1 (H) 06/30/2016   HGBA1C 6.9 01/13/2015   HGBA1C 6.8 09/02/2014  05/14/2020: HbA1c calculated from fructosamine is 6.45%. 12/11/2019: HbA1c calculated from fructosamine is 6.18%, better than the one directly measured. 07/30/2019: HbA1c calculated from fructosamine is actually much better, at 6.06%. 07/04/2018: HbA1c 7.0% 02/20/2018: HbA1c 7.1% 09/2016: C-peptide 1.46  She is on: - Onglyza 5 mg at night >> before breakfast - Jardiance 10 mg before breakfast She was on metformin until 2014 when she developed intolerance: "Bitter mouth".  She tried metformin ER but this caused generalized itching, rash, with generalized pain all over her body. She  tried Invokana.   Pt checks her sugars once a day per review of her meter download: - am:   127-131 >> 98-132 >> 95-141 >> 81, 85-115 - 2h after b'fast: n/c >> 127 >> n/c - before lunch: 115, 173, 214 >> 86-101, 150 >> 124 - 2h after lunch: n/c - before dinner:  n/c >> 135, 167 >> 103 >> 137 >> 98-110 - 2h after dinner: n/c >> 167-171 - bedtime:  159-236 (pizza) >> 189, 202 >> 134-188 >> 190 - nighttime: n/c Lowest sugar was 78 >> 98 >> 86 >> 81; she has hypoglycemia awareness in the 70s.  Highest sugar was 200 (pizza) >> 202 >> 188  >> 190.  Glucometer: Accu-Chek Aviva plus  Pt's meals are: - Breakfast: Oatmeal with pineapple, half a banana, raspberries, blueberries, yogurt, cottage cheese, 1 cup of tea, 1 wheat toast, feta cheese-small piece every morning - Lunch: Soup (1/2 cup), salad, half a cup of rice with veggies, chicken/fish and one fourth of a bowl from morning oatmeal with yogurt and 2 cookies - Dinner: Popcorn (100 cal) or crackers and 2 bottles of water - Snacks: no She saw dietitian in [place] when diagnosed in 2000.  She also saw our dietitian in 08/2018.  She was exercising consistently at [employer] by doing cardio 6 times a week, but stopped during the coronavirus pandemic.  She started exercising at home on the treadmill, but before last visit she returned to the gym.  However, she was experiencing left shoulder pain. As of now, she again exercises consistently, qod.  -No CKD, last BUN/creatinine:  Lab  Results  Component Value Date   BUN 17 07/25/2020   BUN 14 06/13/2019   CREATININE 0.83 07/25/2020   CREATININE 0.74 06/13/2019  07/04/2018: 13/0.88, GFR 58, glucose 123 02/20/2018: ACR 4  -+ HL; last set of lipids: Lab Results  Component Value Date   CHOL 237 (H) 07/25/2020   HDL 65.90 07/25/2020   LDLCALC 140 (H) 07/25/2020   TRIG 158.0 (H) 07/25/2020   CHOLHDL 4 07/25/2020  02/20/2018: 150/114/64/66 She was on Crestor 5 mg 4 times a week >>  stopped 2/2 bitter taste in month. She tried Simvastatin in the past >> leg cramps. She restarted on Crestor 2x a week.    - last eye exam was in 01/2021: No DR reportedly.  - no numbness and tingling in her feet.  Pt has FH of DM in mother, daughter.  She also has a history of osteopenia - diagnosed 04/2015.  She has vitamin D deficiency, psoriasis, history of PE 02/2018, colon polyp. She stopped B12 in 05/2019 as her B12 was too high and she developed dizziness at that time, resolved after stopping B12.  She was an ICU nurse, retired in 2016.  ROS: + See HPI + Joint pains  I reviewed pt's medications, allergies, PMH, social hx, family hx, and changes were documented in the history of present illness. Otherwise, unchanged from my initial visit note.  Past Medical History:  Diagnosis Date   Diabetes mellitus without complication ([place]) 67/20/9470   History of ear infections    Hyperlipidemia    Past Surgical History:  Procedure Laterality Date   TONSILLECTOMY     Social History   Socioeconomic History   Marital status: Married    Spouse name: Not on file   Number of children: 4 Katherine Robinson, Katherine Robinson, Katherine Robinson) (she also had an ectopic pregnancy)    Years of education: Not on file   Highest education level: Not on file  Occupational History    Retired ICU nurse  Social Needs   Financial resource strain: Not on file   Food insecurity:    Worry: Not on file    Inability: Not on file   Transportation needs:    Medical: Not on file    Non-medical: Not on file  Tobacco Use   Smoking status: Never Smoker   Smokeless tobacco: Never Used  Substance and Sexual Activity   Alcohol use:  Yes, wine, 2 drinks weekly   Drug use: No   Current Outpatient Medications on File Prior to Visit  Medication Sig Dispense Refill   Accu-Chek Softclix Lancets lancets USE TO TEST BLOOD SUGAR TWICE DAILY. 100 each 12   Blood Glucose Monitoring Suppl (ACCU-CHEK AVIVA PLUS) w/Device KIT USE TO  TEST BLOOD SUGAR TWICE DAILY 1 kit 0   Cholecalciferol (VITAMIN D3) 50 MCG (2000 UT) TABS Take by mouth.      CRESTOR 5 MG tablet Take 1 tablet (5 mg total) by mouth 2 (two) times a week. DAW-1 24 tablet 3   glucose blood (ACCU-CHEK AVIVA PLUS) test strip USE TO TEST BLOOD SUGAR TWICE DAILY 200 each 12   JARDIANCE 10 MG TABS tablet Take 1 tablet (10 mg total) by mouth daily. 90 tablet 3   Multiple Vitamin (MULTIVITAMIN) tablet Take 1 tablet by mouth daily.     NEOMYCIN-POLYMYXIN-HYDROCORTISONE (CORTISPORIN) 1 % SOLN OTIC solution 2 drops affected ear at bedtime 10 mL 2   ONGLYZA 5 MG TABS tablet Take 1 tablet (5 mg total) by mouth daily. 90 tablet  3   No current facility-administered medications on file prior to visit.   Allergies  Allergen Reactions   Ivp Dye [Iodinated Diagnostic Agents] Anaphylaxis   Sulfa Antibiotics Anaphylaxis   Percocet [Oxycodone-Acetaminophen] Nausea And Vomiting   Glucophage [Metformin Hcl] Rash   Family History  Problem Relation Age of Onset   Hypertension Father    Diabetes Mother    Depression Mother    Mental illness Mother    Kidney disease Mother    Alzheimer's disease Mother    Glaucoma Mother    Breast cancer Neg Hx   Also, HTN in mother, HL in mother.  PE: BP 110/62 (BP Location: Right Arm, Patient Position: Sitting, Cuff Size: Normal)   Pulse 88   Ht _0  (1.575 m)   Wt 125 lb 12.8 oz (57.1 kg)   SpO2 95%   BMI 23.01 kg/m  Wt Readings from Last 3 Encounters:  03/03/21 125 lb 12.8 oz (57.1 kg)  10/20/20 124 lb 9.6 oz (56.5 kg)  07/25/20 128 lb (58.1 kg)   Constitutional: normal weight, in NAD Eyes: PERRLA, EOMI, no exophthalmos ENT: moist mucous membranes, no thyromegaly, no cervical lymphadenopathy Cardiovascular: RRR, No MRG Respiratory: CTA B Gastrointestinal: abdomen soft, NT, ND, BS+ Musculoskeletal: no deformities, strength intact in all 4 Skin: moist, warm, no rashes Neurological: no tremor with outstretched hands, DTR  normal in all 4  ASSESSMENT: 1. DM2, non-insulin-dependent, uncontrolled, without long-term complications, but with hyperglycemia  2. HL  PLAN:  1. Patient with longstanding, previously uncontrolled type 2 diabetes, on oral antidiabetic regimen with SGLT2 inhibitor and DPP 4 inhibitor, with improved control especially after adopting a more plant-based diet in 2020.  HbA1c level decreased to 6.4% but at last visit was higher, at 7.6%.  At that time, we discussed about checking a fructosamine level since this was more accurate for her in the past (however, she did not stop at the lab).  Sugars were mostly at goal at that time with few mildly hyperglycemic exceptions, which she felt were related to dental pain and subsequent dental work.  We did not change her regimen at that time since the majority of the blood sugars were still at goal. -At today's visit, per review of her blood sugars in the meter downloads, the majority of the blood sugars appear at goal, with only 2 exceptions in the last 2 weeks, when sugars are higher, after eating out.  This morning, she had lower blood sugar, but still normal at 81, in the setting of diarrhea.  She is planning to schedule an appointment with PCP to see what she can do for this.  For now, I advised her to continue the current regimen. - I suggested to:  Patient Instructions  Please continue: - Jardiance 10 mg daily before b'fast - Onglyza 5 mg daily before b'fast  Please come back for a follow-up appointment in 4 months.  - we checked her HbA1c: 7.8% (higher - but not clearly correlating with sugars in her log - will check a fructosamine) - advised to check sugars at different times of the day - 1x a day, rotating check times - advised for yearly eye exams >> she is UTD - return to clinic in 4 months  2. HL -Reviewed latest lipid panel from 07/2020: LDL above target, triglycerides only slightly high, HDL at goal: Lab Results  Component Value Date    CHOL 237 (H) 07/25/2020   HDL 65.90 07/25/2020   LDLCALC 140 (H) 07/25/2020  TRIG 158.0 (H) 07/25/2020   CHOLHDL 4 07/25/2020  -Previously on low-dose Crestor, 5 mg 4 times a week but she stopped due to bitter taste in her mouth.  Before last visit she restarted this only twice a week per PCP. -She tried simvastatin but stopped due to leg cramps  Orders Placed This Encounter  Procedures   Fructosamine   Component     Latest Ref Rng & Units 03/16/2021  Fructosamine     205 - 285 umol/L 256  HbA1c calculated from fructosamine is excellent, at 6%.  Philemon Kingdom, MD PhD [facility] Endocrinology

## 2021-03-06 DIAGNOSIS — R1084 Generalized abdominal pain: Secondary | ICD-10-CM | POA: Diagnosis not present

## 2021-03-06 DIAGNOSIS — R110 Nausea: Secondary | ICD-10-CM | POA: Diagnosis not present

## 2021-03-06 DIAGNOSIS — R197 Diarrhea, unspecified: Secondary | ICD-10-CM | POA: Diagnosis not present

## 2021-03-16 ENCOUNTER — Other Ambulatory Visit: Payer: Self-pay

## 2021-03-16 ENCOUNTER — Other Ambulatory Visit (INDEPENDENT_AMBULATORY_CARE_PROVIDER_SITE_OTHER): Payer: Medicare Other

## 2021-03-16 DIAGNOSIS — E1165 Type 2 diabetes mellitus with hyperglycemia: Secondary | ICD-10-CM

## 2021-03-20 LAB — FRUCTOSAMINE: Fructosamine: 256 umol/L (ref 205–285)

## 2021-04-02 DIAGNOSIS — R197 Diarrhea, unspecified: Secondary | ICD-10-CM | POA: Diagnosis not present

## 2021-04-02 DIAGNOSIS — R140 Abdominal distension (gaseous): Secondary | ICD-10-CM | POA: Diagnosis not present

## 2021-04-02 DIAGNOSIS — R933 Abnormal findings on diagnostic imaging of other parts of digestive tract: Secondary | ICD-10-CM | POA: Diagnosis not present

## 2021-04-02 DIAGNOSIS — R1084 Generalized abdominal pain: Secondary | ICD-10-CM | POA: Diagnosis not present

## 2021-04-02 DIAGNOSIS — R112 Nausea with vomiting, unspecified: Secondary | ICD-10-CM | POA: Diagnosis not present

## 2021-04-02 DIAGNOSIS — K769 Liver disease, unspecified: Secondary | ICD-10-CM | POA: Diagnosis not present

## 2021-04-02 DIAGNOSIS — K8689 Other specified diseases of pancreas: Secondary | ICD-10-CM | POA: Diagnosis not present

## 2021-04-02 DIAGNOSIS — R195 Other fecal abnormalities: Secondary | ICD-10-CM | POA: Diagnosis not present

## 2021-04-03 DIAGNOSIS — Z20822 Contact with and (suspected) exposure to covid-19: Secondary | ICD-10-CM | POA: Diagnosis not present

## 2021-04-08 DIAGNOSIS — R197 Diarrhea, unspecified: Secondary | ICD-10-CM | POA: Diagnosis not present

## 2021-04-08 DIAGNOSIS — K8689 Other specified diseases of pancreas: Secondary | ICD-10-CM | POA: Diagnosis not present

## 2021-04-08 DIAGNOSIS — R195 Other fecal abnormalities: Secondary | ICD-10-CM | POA: Diagnosis not present

## 2021-04-09 DIAGNOSIS — R140 Abdominal distension (gaseous): Secondary | ICD-10-CM | POA: Diagnosis not present

## 2021-04-09 DIAGNOSIS — R1084 Generalized abdominal pain: Secondary | ICD-10-CM | POA: Diagnosis not present

## 2021-04-09 DIAGNOSIS — R933 Abnormal findings on diagnostic imaging of other parts of digestive tract: Secondary | ICD-10-CM | POA: Diagnosis not present

## 2021-04-09 DIAGNOSIS — K30 Functional dyspepsia: Secondary | ICD-10-CM | POA: Diagnosis not present

## 2021-04-09 DIAGNOSIS — R112 Nausea with vomiting, unspecified: Secondary | ICD-10-CM | POA: Diagnosis not present

## 2021-04-09 IMAGING — MG DIGITAL SCREENING BILAT W/ TOMO W/ CAD
8 series · 9 of 24 positions shown · non-contrast
Comparison: Previous exam(s).

CLINICAL DATA: Screening.

EXAM:
DIGITAL SCREENING BILATERAL MAMMOGRAM WITH TOMO AND CAD

[L CC synth-2D]
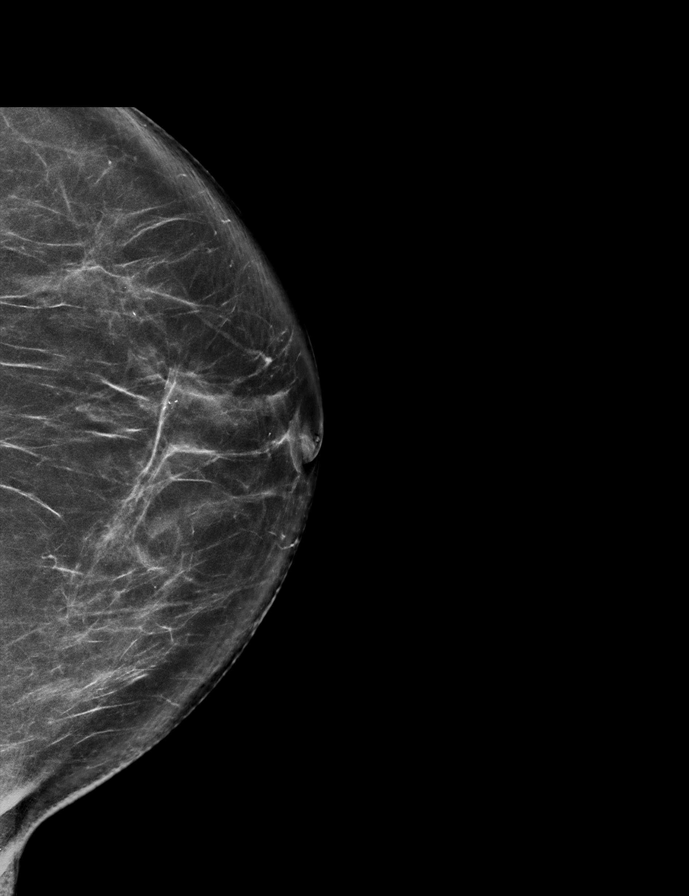

[R MLO synth-2D]
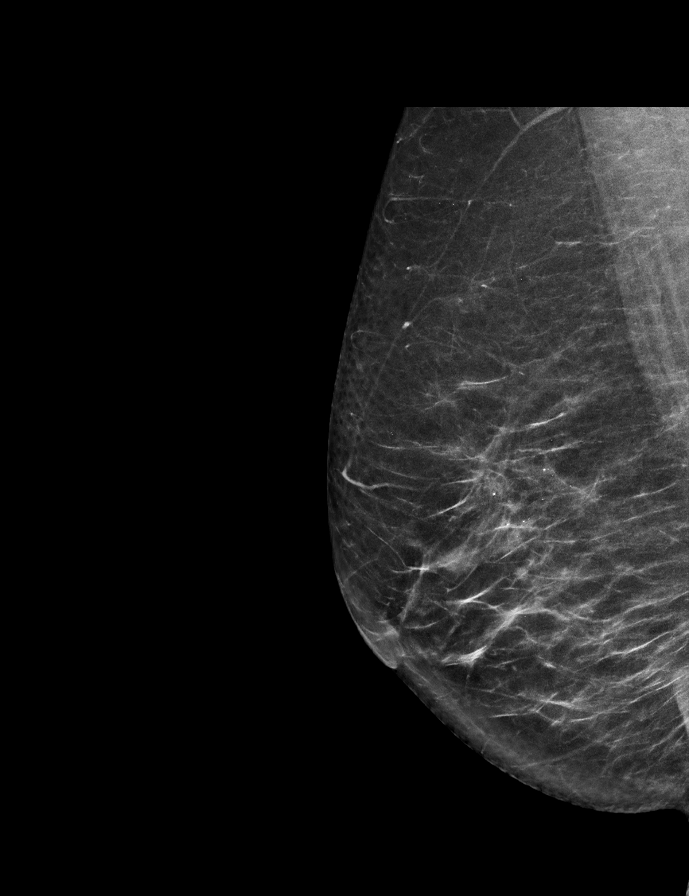

[L MLO synth-2D]
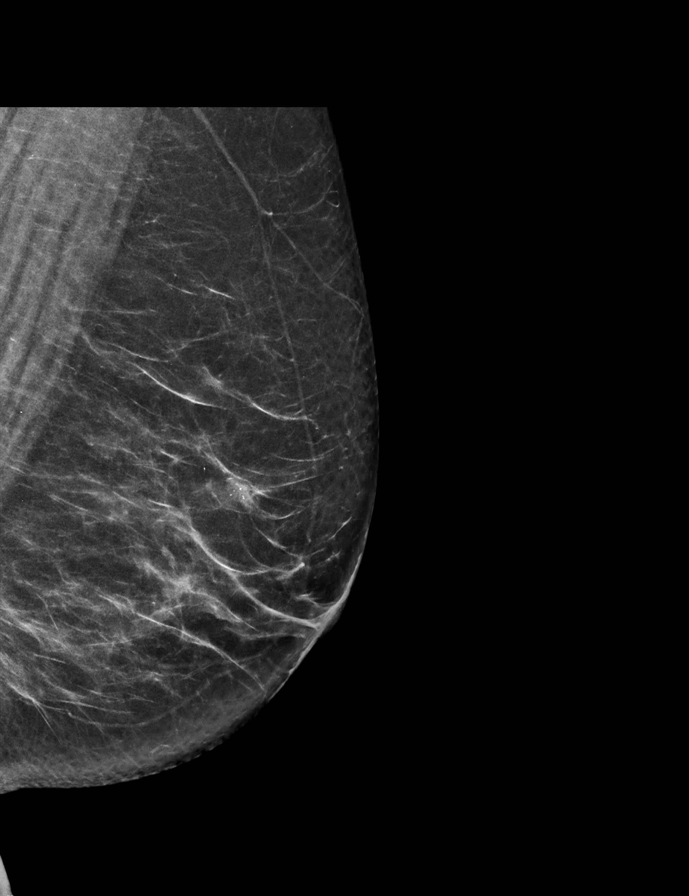

[R CC synth-2D]
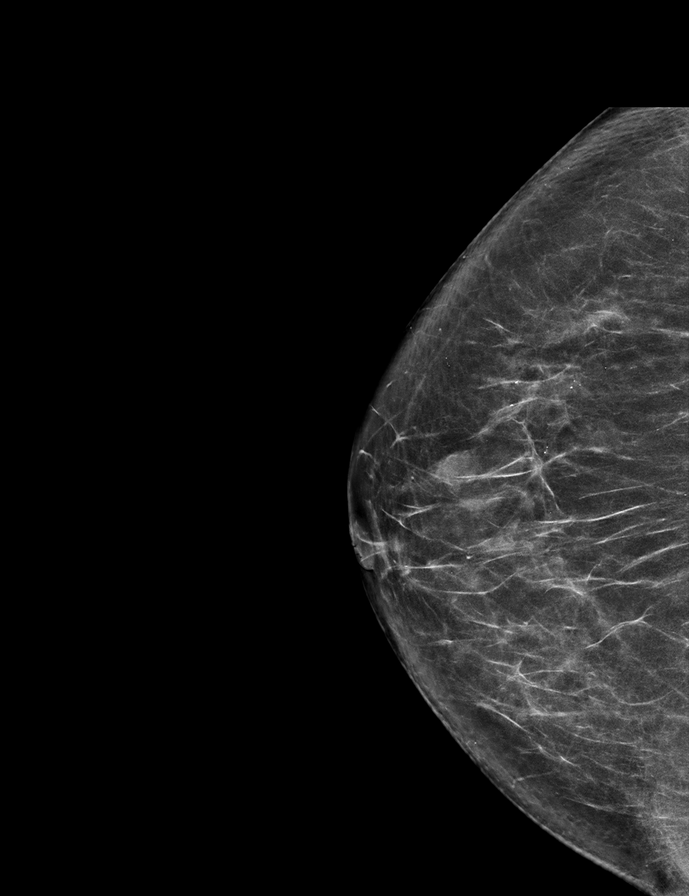

[L MLO tomo · 2 of 66 frames shown]
[frame 22/66]
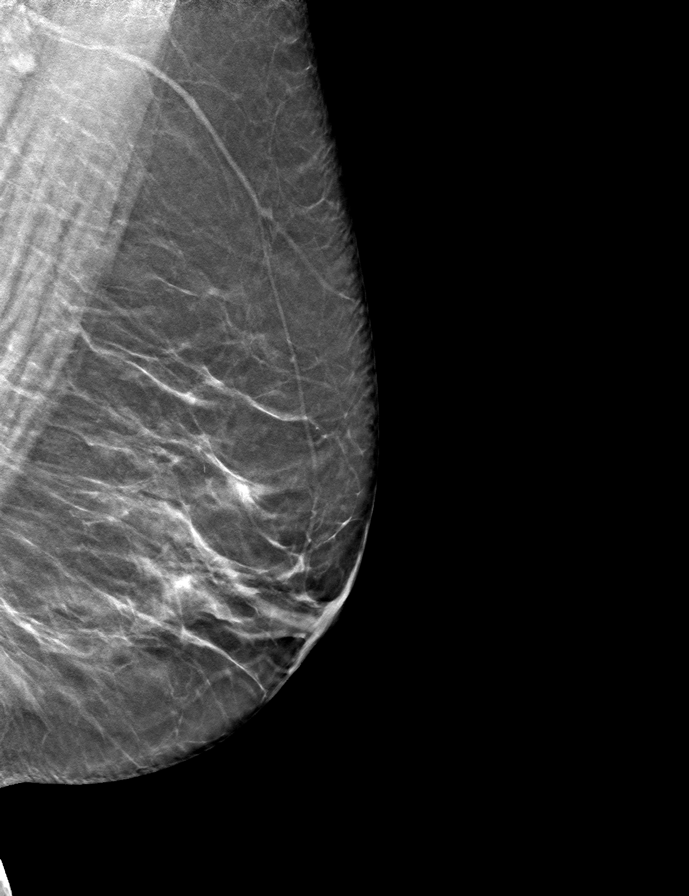
[frame 33/66]
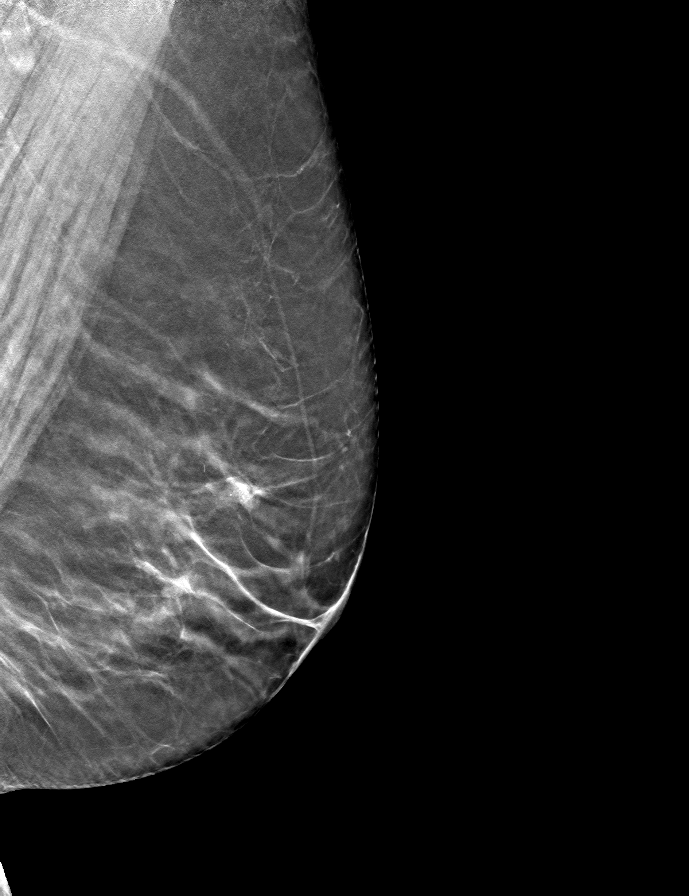

[L CC tomo · tomo slice 38/75.0]
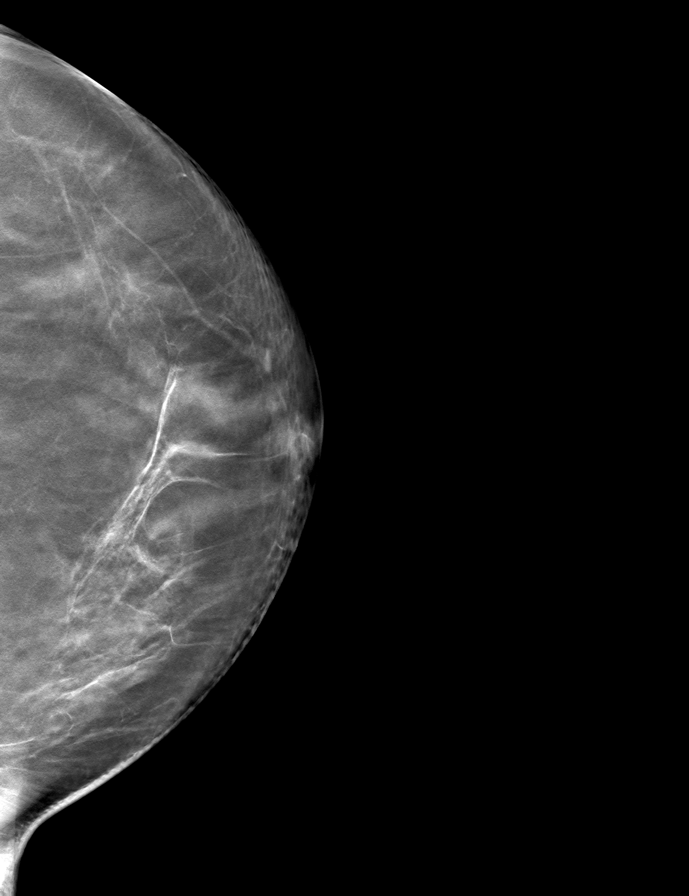

[R CC tomo · tomo slice 35/69.0]
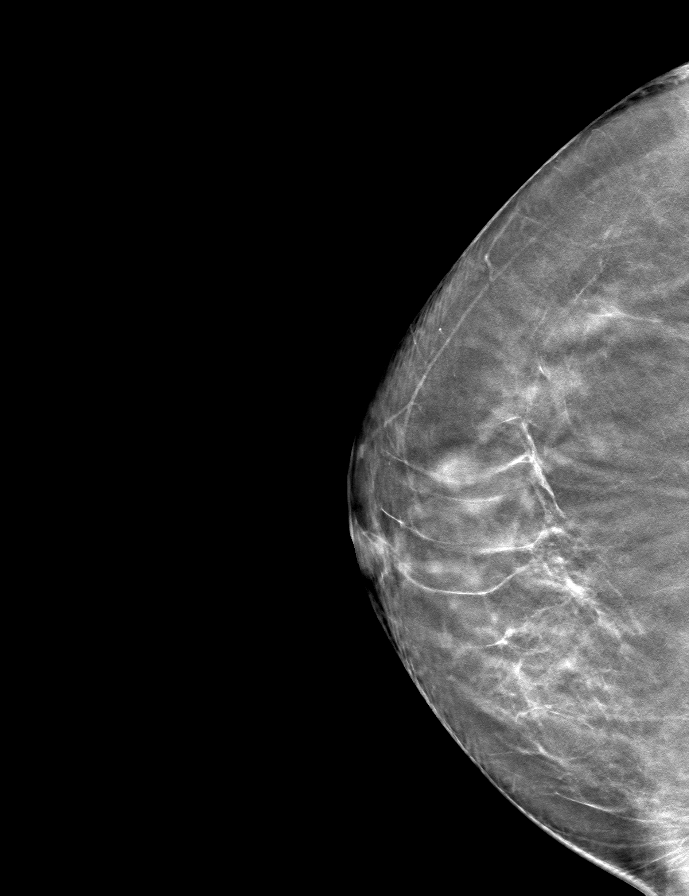

[R MLO tomo · tomo slice 35/68.0]
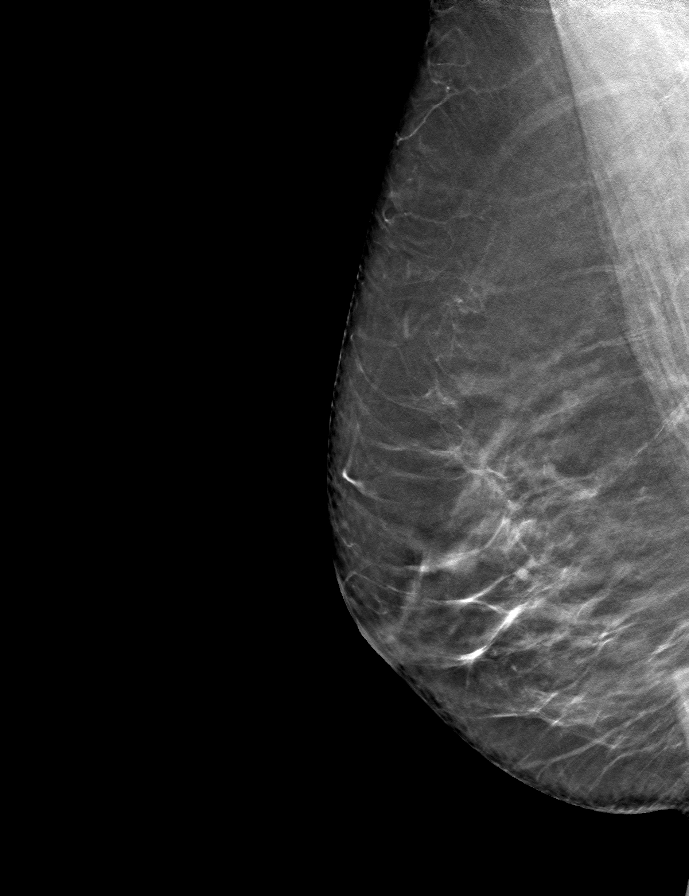

[9 of 24 positions shown; findings below may reference images not displayed]

ACR Breast Density Category b: There are scattered areas of
fibroglandular density.
FINDINGS: There are no findings suspicious for malignancy. Images were
processed with CAD.
IMPRESSION: No mammographic evidence of malignancy. A result letter of this
screening mammogram will be mailed directly to the patient.

RECOMMENDATION:
Screening mammogram in one year. (Code:[id number])

BI-RADS CATEGORY  1: Negative.

## 2021-04-13 DIAGNOSIS — A09 Infectious gastroenteritis and colitis, unspecified: Secondary | ICD-10-CM | POA: Diagnosis not present

## 2021-04-13 DIAGNOSIS — K3184 Gastroparesis: Secondary | ICD-10-CM | POA: Diagnosis not present

## 2021-04-13 DIAGNOSIS — E1143 Type 2 diabetes mellitus with diabetic autonomic (poly)neuropathy: Secondary | ICD-10-CM | POA: Diagnosis not present

## 2021-04-13 DIAGNOSIS — K580 Irritable bowel syndrome with diarrhea: Secondary | ICD-10-CM | POA: Diagnosis not present

## 2021-04-14 DIAGNOSIS — K3184 Gastroparesis: Secondary | ICD-10-CM | POA: Diagnosis not present

## 2021-04-14 DIAGNOSIS — A09 Infectious gastroenteritis and colitis, unspecified: Secondary | ICD-10-CM | POA: Diagnosis not present

## 2021-05-06 DIAGNOSIS — E1143 Type 2 diabetes mellitus with diabetic autonomic (poly)neuropathy: Secondary | ICD-10-CM | POA: Diagnosis not present

## 2021-05-06 DIAGNOSIS — A074 Cyclosporiasis: Secondary | ICD-10-CM | POA: Diagnosis not present

## 2021-05-06 DIAGNOSIS — K3184 Gastroparesis: Secondary | ICD-10-CM | POA: Diagnosis not present

## 2021-05-06 DIAGNOSIS — R634 Abnormal weight loss: Secondary | ICD-10-CM | POA: Diagnosis not present

## 2021-05-06 DIAGNOSIS — A048 Other specified bacterial intestinal infections: Secondary | ICD-10-CM | POA: Diagnosis not present

## 2021-05-31 DIAGNOSIS — Z20822 Contact with and (suspected) exposure to covid-19: Secondary | ICD-10-CM | POA: Diagnosis not present

## 2021-06-01 DIAGNOSIS — R634 Abnormal weight loss: Secondary | ICD-10-CM | POA: Diagnosis not present

## 2021-06-01 DIAGNOSIS — K3184 Gastroparesis: Secondary | ICD-10-CM | POA: Diagnosis not present

## 2021-06-01 DIAGNOSIS — E1143 Type 2 diabetes mellitus with diabetic autonomic (poly)neuropathy: Secondary | ICD-10-CM | POA: Diagnosis not present

## 2021-06-01 DIAGNOSIS — A048 Other specified bacterial intestinal infections: Secondary | ICD-10-CM | POA: Diagnosis not present

## 2021-06-01 DIAGNOSIS — A074 Cyclosporiasis: Secondary | ICD-10-CM | POA: Diagnosis not present

## 2021-06-03 DIAGNOSIS — M79605 Pain in left leg: Secondary | ICD-10-CM | POA: Diagnosis not present

## 2021-06-03 DIAGNOSIS — M25552 Pain in left hip: Secondary | ICD-10-CM | POA: Diagnosis not present

## 2021-06-06 DIAGNOSIS — M25552 Pain in left hip: Secondary | ICD-10-CM | POA: Diagnosis not present

## 2021-06-10 DIAGNOSIS — S76312D Strain of muscle, fascia and tendon of the posterior muscle group at thigh level, left thigh, subsequent encounter: Secondary | ICD-10-CM | POA: Diagnosis not present

## 2021-06-10 DIAGNOSIS — M79605 Pain in left leg: Secondary | ICD-10-CM | POA: Diagnosis not present

## 2021-06-23 DIAGNOSIS — M79605 Pain in left leg: Secondary | ICD-10-CM | POA: Diagnosis not present

## 2021-06-23 DIAGNOSIS — E119 Type 2 diabetes mellitus without complications: Secondary | ICD-10-CM | POA: Diagnosis not present

## 2021-06-23 DIAGNOSIS — E785 Hyperlipidemia, unspecified: Secondary | ICD-10-CM | POA: Diagnosis not present

## 2021-06-23 DIAGNOSIS — M25552 Pain in left hip: Secondary | ICD-10-CM | POA: Diagnosis not present

## 2021-06-23 DIAGNOSIS — E1169 Type 2 diabetes mellitus with other specified complication: Secondary | ICD-10-CM | POA: Diagnosis not present

## 2021-07-03 ENCOUNTER — Ambulatory Visit: Payer: Medicare Other | Admitting: Internal Medicine

## 2021-07-06 DIAGNOSIS — M25552 Pain in left hip: Secondary | ICD-10-CM | POA: Diagnosis not present

## 2021-07-14 DIAGNOSIS — M25552 Pain in left hip: Secondary | ICD-10-CM | POA: Diagnosis not present

## 2021-08-06 DIAGNOSIS — Z20822 Contact with and (suspected) exposure to covid-19: Secondary | ICD-10-CM | POA: Diagnosis not present

## 2021-08-27 DIAGNOSIS — A048 Other specified bacterial intestinal infections: Secondary | ICD-10-CM | POA: Diagnosis not present

## 2021-09-01 DIAGNOSIS — M25552 Pain in left hip: Secondary | ICD-10-CM | POA: Diagnosis not present

## 2021-10-02 ENCOUNTER — Other Ambulatory Visit: Payer: Self-pay | Admitting: Family Medicine

## 2021-11-25 DIAGNOSIS — E1169 Type 2 diabetes mellitus with other specified complication: Secondary | ICD-10-CM | POA: Diagnosis not present

## 2021-11-25 DIAGNOSIS — E785 Hyperlipidemia, unspecified: Secondary | ICD-10-CM | POA: Diagnosis not present

## 2021-11-27 ENCOUNTER — Ambulatory Visit: Payer: Self-pay

## 2021-11-27 NOTE — Patient Outreach (Signed)
  Care Coordination   Initial Visit Note   11/27/2021 Name: Sakira Dahmer MRN: [id number] DOB: Jun 29, 1945  Clare Gandy Canizales is a 76 y.o. year old female who sees Bernerd Limbo, MD for primary care. I spoke with  [name] by phone today  What matters to the patients health and wellness today?  No acute concerns; appointment scheduled with new primary care provider for 9/18    Goals Addressed             This Visit's Progress    COMPLETED: Care Coordination Activities - no follow up required       Care Coordination Interventions: Outbound call placed to the patient to confirm PCP selection considering she has not been seen by assigned provider since 07/25/20 Determined the patient has chosen to switch to Dr. Coletta Memos - chart updated to reflect patient choice        SDOH assessments and interventions completed:  No     Care Coordination Interventions Activated:  No  Care Coordination Interventions:  No, not indicated   Follow up plan: No further intervention required.   Encounter Outcome:  Pt. Visit Completed   Daneen Schick, BSW, CDP Social Worker, Certified Dementia Practitioner Care Coordination [phone]

## 2022-01-11 DIAGNOSIS — Z23 Encounter for immunization: Secondary | ICD-10-CM | POA: Diagnosis not present

## 2022-01-11 DIAGNOSIS — M8580 Other specified disorders of bone density and structure, unspecified site: Secondary | ICD-10-CM | POA: Diagnosis not present

## 2022-01-11 DIAGNOSIS — Z0000 Encounter for general adult medical examination without abnormal findings: Secondary | ICD-10-CM | POA: Diagnosis not present

## 2022-01-11 DIAGNOSIS — E119 Type 2 diabetes mellitus without complications: Secondary | ICD-10-CM | POA: Diagnosis not present

## 2022-01-11 DIAGNOSIS — Z8619 Personal history of other infectious and parasitic diseases: Secondary | ICD-10-CM | POA: Diagnosis not present

## 2022-03-13 DIAGNOSIS — Z20822 Contact with and (suspected) exposure to covid-19: Secondary | ICD-10-CM | POA: Diagnosis not present

## 2022-03-13 DIAGNOSIS — Z1152 Encounter for screening for COVID-19: Secondary | ICD-10-CM | POA: Diagnosis not present

## 2022-04-12 DIAGNOSIS — Z20822 Contact with and (suspected) exposure to covid-19: Secondary | ICD-10-CM | POA: Diagnosis not present

## 2022-04-12 DIAGNOSIS — Z1152 Encounter for screening for COVID-19: Secondary | ICD-10-CM | POA: Diagnosis not present

## 2022-05-27 DIAGNOSIS — Z20822 Contact with and (suspected) exposure to covid-19: Secondary | ICD-10-CM | POA: Diagnosis not present

## 2022-05-27 DIAGNOSIS — Z1152 Encounter for screening for COVID-19: Secondary | ICD-10-CM | POA: Diagnosis not present

## 2022-06-01 DIAGNOSIS — Z1330 Encounter for screening examination for mental health and behavioral disorders, unspecified: Secondary | ICD-10-CM | POA: Diagnosis not present

## 2022-06-01 DIAGNOSIS — E1169 Type 2 diabetes mellitus with other specified complication: Secondary | ICD-10-CM | POA: Diagnosis not present

## 2022-06-01 DIAGNOSIS — E785 Hyperlipidemia, unspecified: Secondary | ICD-10-CM | POA: Diagnosis not present

## 2022-06-01 DIAGNOSIS — E119 Type 2 diabetes mellitus without complications: Secondary | ICD-10-CM | POA: Diagnosis not present

## 2022-06-28 DIAGNOSIS — Z1152 Encounter for screening for COVID-19: Secondary | ICD-10-CM | POA: Diagnosis not present

## 2022-06-28 DIAGNOSIS — Z20822 Contact with and (suspected) exposure to covid-19: Secondary | ICD-10-CM | POA: Diagnosis not present

## 2022-08-02 DIAGNOSIS — Z20822 Contact with and (suspected) exposure to covid-19: Secondary | ICD-10-CM | POA: Diagnosis not present

## 2022-08-02 DIAGNOSIS — Z1152 Encounter for screening for COVID-19: Secondary | ICD-10-CM | POA: Diagnosis not present

## 2022-08-27 DIAGNOSIS — Z1152 Encounter for screening for COVID-19: Secondary | ICD-10-CM | POA: Diagnosis not present

## 2022-08-27 DIAGNOSIS — Z20822 Contact with and (suspected) exposure to covid-19: Secondary | ICD-10-CM | POA: Diagnosis not present

## 2022-09-25 DIAGNOSIS — Z20822 Contact with and (suspected) exposure to covid-19: Secondary | ICD-10-CM | POA: Diagnosis not present

## 2022-09-25 DIAGNOSIS — Z1152 Encounter for screening for COVID-19: Secondary | ICD-10-CM | POA: Diagnosis not present

## 2022-10-22 DIAGNOSIS — E119 Type 2 diabetes mellitus without complications: Secondary | ICD-10-CM | POA: Diagnosis not present

## 2022-10-22 DIAGNOSIS — Z6821 Body mass index (BMI) 21.0-21.9, adult: Secondary | ICD-10-CM | POA: Diagnosis not present

## 2022-10-22 DIAGNOSIS — Z1159 Encounter for screening for other viral diseases: Secondary | ICD-10-CM | POA: Diagnosis not present

## 2022-10-25 DIAGNOSIS — E119 Type 2 diabetes mellitus without complications: Secondary | ICD-10-CM | POA: Diagnosis not present

## 2022-10-25 DIAGNOSIS — Z1159 Encounter for screening for other viral diseases: Secondary | ICD-10-CM | POA: Diagnosis not present

## 2023-01-27 DIAGNOSIS — Z6822 Body mass index (BMI) 22.0-22.9, adult: Secondary | ICD-10-CM | POA: Diagnosis not present

## 2023-01-27 DIAGNOSIS — E782 Mixed hyperlipidemia: Secondary | ICD-10-CM | POA: Diagnosis not present

## 2023-01-27 DIAGNOSIS — E119 Type 2 diabetes mellitus without complications: Secondary | ICD-10-CM | POA: Diagnosis not present

## 2023-01-27 DIAGNOSIS — Z0000 Encounter for general adult medical examination without abnormal findings: Secondary | ICD-10-CM | POA: Diagnosis not present

## 2023-01-27 DIAGNOSIS — Z23 Encounter for immunization: Secondary | ICD-10-CM | POA: Diagnosis not present

## 2023-01-27 DIAGNOSIS — Z780 Asymptomatic menopausal state: Secondary | ICD-10-CM | POA: Diagnosis not present

## 2023-02-05 DIAGNOSIS — Z23 Encounter for immunization: Secondary | ICD-10-CM | POA: Diagnosis not present

## 2023-03-07 ENCOUNTER — Other Ambulatory Visit: Payer: Self-pay | Admitting: Family Medicine

## 2023-03-07 DIAGNOSIS — Z780 Asymptomatic menopausal state: Secondary | ICD-10-CM

## 2023-03-22 DIAGNOSIS — E119 Type 2 diabetes mellitus without complications: Secondary | ICD-10-CM | POA: Diagnosis not present

## 2023-03-28 DIAGNOSIS — Z780 Asymptomatic menopausal state: Secondary | ICD-10-CM | POA: Diagnosis not present

## 2023-03-28 DIAGNOSIS — M810 Age-related osteoporosis without current pathological fracture: Secondary | ICD-10-CM | POA: Diagnosis not present

## 2023-03-29 DIAGNOSIS — H43812 Vitreous degeneration, left eye: Secondary | ICD-10-CM | POA: Diagnosis not present

## 2023-03-29 DIAGNOSIS — E119 Type 2 diabetes mellitus without complications: Secondary | ICD-10-CM | POA: Diagnosis not present

## 2023-03-29 DIAGNOSIS — Z6821 Body mass index (BMI) 21.0-21.9, adult: Secondary | ICD-10-CM | POA: Diagnosis not present

## 2023-03-29 DIAGNOSIS — H43391 Other vitreous opacities, right eye: Secondary | ICD-10-CM | POA: Diagnosis not present

## 2023-03-29 DIAGNOSIS — H5203 Hypermetropia, bilateral: Secondary | ICD-10-CM | POA: Diagnosis not present

## 2023-03-29 DIAGNOSIS — H2513 Age-related nuclear cataract, bilateral: Secondary | ICD-10-CM | POA: Diagnosis not present

## 2023-03-29 DIAGNOSIS — E782 Mixed hyperlipidemia: Secondary | ICD-10-CM | POA: Diagnosis not present

## 2023-03-29 DIAGNOSIS — H52223 Regular astigmatism, bilateral: Secondary | ICD-10-CM | POA: Diagnosis not present

## 2023-03-29 DIAGNOSIS — H524 Presbyopia: Secondary | ICD-10-CM | POA: Diagnosis not present

## 2023-09-21 ENCOUNTER — Telehealth: Payer: Self-pay | Admitting: Family Medicine

## 2023-09-21 NOTE — Telephone Encounter (Signed)
**Note De-identified  [place] Obfuscation** Please advise 

## 2023-09-21 NOTE — Telephone Encounter (Unsigned)
 Copied from CRM [id number]. Topic: Appointments - Scheduling Inquiry for Clinic >> Sep 21, 2023  3:01 PM Katherine Robinson wrote: Reason for CRM: Patient called to make an appointment with Dr. Valdene Garret because she used to be one of his patients and she would also like to schedule an appointment for her Husband, Katherine Robinson who has a brain surgery on 09/01/23 with Dr. [name] as well if he would be willing to accept them as new patients.   Call back number [phone]

## 2023-09-22 NOTE — Telephone Encounter (Signed)
 Unable to accept at this time.  Katherine Robinson. [name], MD 09/22/2023 8:49 AM

## 2023-09-22 NOTE — Telephone Encounter (Signed)
 See PCP note.

## 2023-09-23 ENCOUNTER — Telehealth: Payer: Self-pay

## 2023-09-23 NOTE — Telephone Encounter (Signed)
Patient scheduled with Dr.Morrison.

## 2023-09-23 NOTE — Telephone Encounter (Signed)
 Unable to accept at this time.  Katherine Robinson. [name], MD 09/23/2023 9:26 AM

## 2023-09-23 NOTE — Telephone Encounter (Signed)
 Copied from CRM [id number]. Topic: General - Other >> Sep 23, 2023  2:40 PM Katherine Robinson wrote: Reason for CRM: Patient is calling back regarding her and her husbands request to see Dr. [name] as a PCP. Advised patient that he in unable to accept them at the time - she is requesting more information on why & requested to speak with one of his nurses.   Advised to send to admin pool.

## 2023-09-23 NOTE — Telephone Encounter (Signed)
 Copied from CRM [id number]. Topic: Appointments - Scheduling Inquiry for Clinic >> Sep 21, 2023  3:01 PM Katherine Robinson wrote: Reason for CRM: Patient called to make an appointment with Dr. Valdene Garret because she used to be one of his patients and she would also like to schedule an appointment for her Husband, Katherine Robinson who has a brain surgery on 09/01/23 with Dr. [name] as well if he would be willing to accept them as new patients.   Call back number :[phone] >> Sep 22, 2023  5:51 PM Clyde Darling P wrote: Pt advise she never receive a callback in regard to scheduling    Please see former pt call and advise is willing to see pt and her husband.

## 2023-11-17 ENCOUNTER — Ambulatory Visit: Admitting: Internal Medicine
